# Patient Record
Sex: Female | Born: 1935 | Race: White | Hispanic: No | Smoking: Never smoker
Health system: Southern US, Community
[De-identification: ages and names within clinical notes are randomized; demographics above are authoritative.]

## PROBLEM LIST (undated history)

## (undated) DIAGNOSIS — F32A Depression, unspecified: Secondary | ICD-10-CM

## (undated) DIAGNOSIS — G629 Polyneuropathy, unspecified: Secondary | ICD-10-CM

## (undated) DIAGNOSIS — K219 Gastro-esophageal reflux disease without esophagitis: Secondary | ICD-10-CM

## (undated) DIAGNOSIS — F329 Major depressive disorder, single episode, unspecified: Secondary | ICD-10-CM

## (undated) DIAGNOSIS — I10 Essential (primary) hypertension: Secondary | ICD-10-CM

## (undated) DIAGNOSIS — E039 Hypothyroidism, unspecified: Secondary | ICD-10-CM

## (undated) DIAGNOSIS — M1990 Unspecified osteoarthritis, unspecified site: Secondary | ICD-10-CM

## (undated) HISTORY — PX: COLONOSCOPY: SHX174

## (undated) HISTORY — PX: ABDOMINAL HYSTERECTOMY: SHX81

## (undated) HISTORY — PX: DILATION AND CURETTAGE OF UTERUS: SHX78

## (undated) HISTORY — PX: BACK SURGERY: SHX140

## (undated) HISTORY — PX: APPENDECTOMY: SHX54

## (undated) HISTORY — PX: THYROIDECTOMY: SHX17

---

## 2011-12-09 ENCOUNTER — Encounter (INDEPENDENT_AMBULATORY_CARE_PROVIDER_SITE_OTHER): Payer: Self-pay | Admitting: *Deleted

## 2011-12-17 ENCOUNTER — Encounter (INDEPENDENT_AMBULATORY_CARE_PROVIDER_SITE_OTHER): Payer: Self-pay | Admitting: *Deleted

## 2011-12-17 ENCOUNTER — Other Ambulatory Visit (INDEPENDENT_AMBULATORY_CARE_PROVIDER_SITE_OTHER): Payer: Self-pay | Admitting: *Deleted

## 2011-12-17 ENCOUNTER — Telehealth (INDEPENDENT_AMBULATORY_CARE_PROVIDER_SITE_OTHER): Payer: Self-pay | Admitting: *Deleted

## 2011-12-17 DIAGNOSIS — Z86010 Personal history of colonic polyps: Secondary | ICD-10-CM

## 2011-12-17 DIAGNOSIS — Z1211 Encounter for screening for malignant neoplasm of colon: Secondary | ICD-10-CM

## 2011-12-17 NOTE — Telephone Encounter (Signed)
Patient needs movi prep 

## 2011-12-18 MED ORDER — PEG-KCL-NACL-NASULF-NA ASC-C 100 G PO SOLR: 1.0000 | Freq: Once | ORAL | Status: DC

## 2012-01-12 ENCOUNTER — Telehealth (INDEPENDENT_AMBULATORY_CARE_PROVIDER_SITE_OTHER): Payer: Self-pay | Admitting: *Deleted

## 2012-01-12 DIAGNOSIS — Z1211 Encounter for screening for malignant neoplasm of colon: Secondary | ICD-10-CM

## 2012-01-12 NOTE — Telephone Encounter (Signed)
Patient need trilyte, movi prep too expensive

## 2012-01-14 MED ORDER — PEG 3350-KCL-NA BICARB-NACL 420 G PO SOLR: 4000.0000 mL | Freq: Once | ORAL | Status: AC

## 2012-01-21 ENCOUNTER — Telehealth (INDEPENDENT_AMBULATORY_CARE_PROVIDER_SITE_OTHER): Payer: Self-pay | Admitting: *Deleted

## 2012-01-21 NOTE — Telephone Encounter (Signed)
PCP/Requesting MD: vyas  Name & DOB: Nicole Simon [birth year]   Procedure: tcs  Reason/Indication:  Hx polyps  Has patient had this procedure before?  yes  If so, when, by whom and where?  4 yrs ago, [facility].  Is there a family history of colon cancer?  no  Who?  What age when diagnosed?    Is patient diabetic?   no      Does patient have prosthetic heart valve?  no  Do you have a pacemaker?  no  Has patient had joint replacement within last 12 months?  no  Is patient on Coumadin, Plavix and/or Aspirin? yes  Medications: asa 81 mg daily, escitalopram 20 mg daily, potassium 10 mg daily, simvastatin 20 mg daily, amlodipine 5 mg daily, levothyroxine 88 mg daily, losartan 100 mg daily, hctz 25 mg daily  Allergies: pcn  Medication Adjustment: asa 2 days  Procedure date & time: 02/17/12 at 1030

## 2012-01-21 NOTE — Telephone Encounter (Signed)
agree

## 2012-02-09 ENCOUNTER — Encounter (HOSPITAL_COMMUNITY): Payer: Self-pay | Admitting: Pharmacy Technician

## 2012-02-16 MED ORDER — SODIUM CHLORIDE 0.45 % IV SOLN
INTRAVENOUS | Status: DC
  Administered 2012-02-17: 10:00:00 via INTRAVENOUS

## 2012-02-17 ENCOUNTER — Encounter (HOSPITAL_COMMUNITY): Payer: Self-pay | Admitting: *Deleted

## 2012-02-17 ENCOUNTER — Ambulatory Visit (HOSPITAL_COMMUNITY)
Admission: RE | Admit: 2012-02-17 | Discharge: 2012-02-17 | Disposition: A | Discharge status: Home / Self Care | Payer: Medicare Other | Source: Ambulatory Visit | Attending: Internal Medicine | Admitting: Internal Medicine

## 2012-02-17 ENCOUNTER — Encounter (HOSPITAL_COMMUNITY)
Admission: RE | Disposition: A | Discharge status: Home / Self Care | Payer: Self-pay | Source: Ambulatory Visit | Attending: Internal Medicine

## 2012-02-17 DIAGNOSIS — Z860100 Personal history of colon polyps, unspecified: Secondary | ICD-10-CM | POA: Insufficient documentation

## 2012-02-17 DIAGNOSIS — D126 Benign neoplasm of colon, unspecified: Secondary | ICD-10-CM | POA: Insufficient documentation

## 2012-02-17 DIAGNOSIS — I10 Essential (primary) hypertension: Secondary | ICD-10-CM | POA: Insufficient documentation

## 2012-02-17 HISTORY — PX: COLONOSCOPY: SHX5424

## 2012-02-17 HISTORY — DX: Essential (primary) hypertension: I10

## 2012-02-17 HISTORY — DX: Unspecified osteoarthritis, unspecified site: M19.90

## 2012-02-17 HISTORY — DX: Hypothyroidism, unspecified: E03.9

## 2012-02-17 SURGERY — COLONOSCOPY
Anesthesia: Moderate Sedation

## 2012-02-17 MED ORDER — MIDAZOLAM HCL 5 MG/5ML IJ SOLN
INTRAMUSCULAR | Status: DC | PRN
  Administered 2012-02-17 (×2): 2 mg via INTRAVENOUS
  Administered 2012-02-17 (×2): 1 mg via INTRAVENOUS

## 2012-02-17 MED ORDER — MEPERIDINE HCL 50 MG/ML IJ SOLN
INTRAMUSCULAR | Status: AC
  Filled 2012-02-17: qty 1

## 2012-02-17 MED ORDER — STERILE WATER FOR IRRIGATION IR SOLN
Status: DC | PRN
  Administered 2012-02-17: 10:00:00

## 2012-02-17 MED ORDER — MIDAZOLAM HCL 5 MG/5ML IJ SOLN
INTRAMUSCULAR | Status: AC
  Filled 2012-02-17: qty 10

## 2012-02-17 MED ORDER — MEPERIDINE HCL 50 MG/ML IJ SOLN
INTRAMUSCULAR | Status: DC | PRN
  Administered 2012-02-17 (×2): 25 mg via INTRAVENOUS

## 2012-02-17 NOTE — Op Note (Signed)
COLONOSCOPY PROCEDURE REPORT  PATIENT:  Nicole Simon  MR#:  [id number] Birthdate:  Mar 07, 1934, 78 y.o., female Endoscopist:  Dr. Malissa Hippo, MD Referred By:  Dr. Ignatius Specking, MD Procedure Date: 02/17/2012  Procedure:   Colonoscopy  Indications:  Patient is 78 year old Caucasian female who has  history of colonic adenomas and here for surveillance colonoscopy.  Informed Consent:  The procedure and risks were reviewed with the patient and informed consent was obtained.  Medications:  Demerol 50 mg IV Versed 6 mg IV  Description of procedure:  After a digital rectal exam was performed, that colonoscope was advanced from the anus through the rectum and colon to the area of the cecum, ileocecal valve and appendiceal orifice. The cecum was deeply intubated. These structures were well-seen and photographed for the record. From the level of the cecum and ileocecal valve, the scope was slowly and cautiously withdrawn. The mucosal surfaces were carefully surveyed utilizing scope tip to flexion to facilitate fold flattening as needed. The scope was pulled down into the rectum where a thorough exam including retroflexion was performed.  Findings:   Prep satisfactory. Few scattered diverticula at sigmoid colon. Four small polyps ablated via cold biopsy from transverse colon and submitted together. Three small polyps ablated via cold biopsy from splenic flexure and submitted together. Normal rectal mucosa. Two small anal papillae.  Therapeutic/Diagnostic Maneuvers Performed:  See above  Complications:  None  Cecal Withdrawal Time:  25  minutes  Impression:  Examination performed to cecum. Seven small polyps ablated via cold biopsy; four from transverse colon and submitted in one container and 3 from splenic flexure in other. Two small anal papillae.  Recommendations:  Standard instructions given. I will contact patient with biopsy results and further recommendations.  REHMAN,NAJEEB  U  02/17/2012 11:23 AM  CC: Dr. Ignatius Specking., MD & Dr. Bonnetta Barry ref. provider found

## 2012-02-17 NOTE — H&P (Signed)
Nicole Simon is an 78 y.o. female.   Chief Complaint: Patient is here for colonoscopy. HPI: Patient is 78 year old Caucasian female with history of colonic adenomas. This is patient's fourth exam. Her last exam was about 4 years ago when she was living in [place]. She denies rectal bleeding. She has chronic constipation and is on MiraLax. She complains of intermittent seepage. She has good appetite and stable weight. Family history is negative for colorectal carcinoma.  Past Medical History  Diagnosis Date  . Hypertension   . Arthritis   . Hypothyroidism     Past Surgical History  Procedure Date  . Thyroidectomy   . Abdominal hysterectomy   . Appendectomy   . Dilation and curettage of uterus   . Back surgery   . Colonoscopy     History reviewed. No pertinent family history. Social History:  reports that she has never smoked. She does not have any smokeless tobacco history on file. She reports that she does not drink alcohol or use illicit drugs.  Allergies:  Allergies  Allergen Reactions  . Penicillins     hives    Medications Prior to Admission  Medication Sig Dispense Refill  . amLODipine (NORVASC) 5 MG tablet Take 5 mg by mouth daily.      Marland Kitchen aspirin 81 MG tablet Take 81 mg by mouth daily.      Marland Kitchen escitalopram (LEXAPRO) 20 MG tablet Take 20 mg by mouth at bedtime.      . hydrochlorothiazide (HYDRODIURIL) 25 MG tablet Take 25 mg by mouth daily.      Marland Kitchen levothyroxine (SYNTHROID, LEVOTHROID) 88 MCG tablet Take 88 mcg by mouth daily.      Marland Kitchen losartan (COZAAR) 100 MG tablet Take 100 mg by mouth daily.      . Multiple Minerals-Vitamins (CITRACAL PLUS PO) Take 1 tablet by mouth every 3 (three) days.      . peg 3350 powder (MOVIPREP) 100 G SOLR Take 1 kit (100 g total) by mouth once.  1 kit  0  . polyethylene glycol (MIRALAX / GLYCOLAX) packet Take 17 g by mouth daily.      . potassium chloride (MICRO-K) 10 MEQ CR capsule Take 10 mEq by mouth daily.      . simvastatin (ZOCOR) 20  MG tablet Take 20 mg by mouth at bedtime.        No results found for this or any previous visit (from the past 48 hour(s)). No results found.  ROS  Blood pressure 130/69, pulse 76, temperature 97.6 F (36.4 C), temperature source Oral, resp. rate 18, height 5' 2.5" (1.588 m), weight 157 lb (71.215 kg), SpO2 93.00%. Physical Exam  Constitutional: She appears well-developed and well-nourished.  HENT:  Mouth/Throat: Oropharynx is clear and moist.  Eyes: Conjunctivae normal are normal. No scleral icterus.  Neck: No thyromegaly present.  Cardiovascular: Normal rate, regular rhythm and normal heart sounds.   No murmur heard. Respiratory: Effort normal and breath sounds normal.  GI: Soft. She exhibits no distension and no mass. There is no tenderness.       Appendectomy and lower midline scar  Musculoskeletal: She exhibits no edema.  Lymphadenopathy:    She has no cervical adenopathy.  Neurological: She is alert.  Skin: Skin is warm and dry.     Assessment/Plan History of colonic adenomas. Surveillance colonoscopy.  Zylen Wenig U 02/17/2012, 10:25 AM

## 2012-02-23 ENCOUNTER — Encounter (HOSPITAL_COMMUNITY): Payer: Self-pay | Admitting: Internal Medicine

## 2012-02-23 ENCOUNTER — Encounter (INDEPENDENT_AMBULATORY_CARE_PROVIDER_SITE_OTHER): Payer: Self-pay | Admitting: *Deleted

## 2012-03-30 ENCOUNTER — Encounter (INDEPENDENT_AMBULATORY_CARE_PROVIDER_SITE_OTHER): Payer: Self-pay | Admitting: *Deleted

## 2012-04-11 ENCOUNTER — Encounter (INDEPENDENT_AMBULATORY_CARE_PROVIDER_SITE_OTHER): Payer: Self-pay | Admitting: Internal Medicine

## 2012-04-11 ENCOUNTER — Ambulatory Visit (INDEPENDENT_AMBULATORY_CARE_PROVIDER_SITE_OTHER): Payer: Medicare Other | Admitting: Internal Medicine

## 2012-04-11 VITALS — BP 138/56 | HR 64 | Temp 98.0°F | Ht 62.0 in | Wt 158.3 lb

## 2012-04-11 DIAGNOSIS — K602 Anal fissure, unspecified: Secondary | ICD-10-CM

## 2012-04-11 DIAGNOSIS — E039 Hypothyroidism, unspecified: Secondary | ICD-10-CM

## 2012-04-11 DIAGNOSIS — I10 Essential (primary) hypertension: Secondary | ICD-10-CM

## 2012-04-11 DIAGNOSIS — E7800 Pure hypercholesterolemia, unspecified: Secondary | ICD-10-CM | POA: Insufficient documentation

## 2012-04-11 MED ORDER — HYDROCORTISONE 2.5 % RE CREA: TOPICAL_CREAM | Freq: Two times a day (BID) | RECTAL | Status: DC

## 2012-04-11 NOTE — Patient Instructions (Addendum)
Nitroglycerin oint 0.2% called to pharmacy.  Anusol supp PR.  PR in 2 weeks

## 2012-04-11 NOTE — Progress Notes (Signed)
Subjective:     Patient ID: Nicole Simon, female   DOB: 08/09/1933, 78 y.o.   MRN: [id number]  HPI Presents today with c/o that it hurts to sit down x 3 weeks. She also has fecal seepage for a while when she takes the Miralax. She has a hx of constipation She tells me she had surgery for a rectal fissure years ago.  Appetite is good. No weight loss. Usually has a BM 2-3 times a day with Miralax.  If she doesn't take Miralax daily, she will be constipated.  No melena or bright red rectal bleeding. 02/17/2012 Colonoscopy:Few scattered diverticula at sigmoid colon.  Four small polyps ablated via cold biopsy from transverse colon and submitted together.  Three small polyps ablated via cold biopsy from splenic flexure and submitted together.  Normal rectal mucosa.  Two small anal papillae. Biopsy: all were adenomas. Next colonoscopy in 3 yrs.  Review of Systems see hpi    Current Outpatient Prescriptions  Medication Sig Dispense Refill  . amLODipine (NORVASC) 5 MG tablet Take 5 mg by mouth daily.      Marland Kitchen aspirin 81 MG tablet Take 81 mg by mouth daily.      Marland Kitchen escitalopram (LEXAPRO) 20 MG tablet Take 20 mg by mouth at bedtime.      . hydrochlorothiazide (HYDRODIURIL) 25 MG tablet Take 25 mg by mouth daily.      Marland Kitchen levothyroxine (SYNTHROID, LEVOTHROID) 88 MCG tablet Take 88 mcg by mouth daily.      Marland Kitchen losartan (COZAAR) 100 MG tablet Take 100 mg by mouth daily.      . polyethylene glycol (MIRALAX / GLYCOLAX) packet Take 17 g by mouth daily.      . potassium chloride (MICRO-K) 10 MEQ CR capsule Take 10 mEq by mouth daily.      . simvastatin (ZOCOR) 20 MG tablet Take 20 mg by mouth at bedtime.       Past Medical History  Diagnosis Date  . Hypertension   . Arthritis   . Hypothyroidism    Past Surgical History  Procedure Date  . Thyroidectomy   . Abdominal hysterectomy   . Appendectomy   . Dilation and curettage of uterus   . Back surgery   . Colonoscopy   . Colonoscopy 02/17/2012   Procedure: COLONOSCOPY;  Surgeon: Malissa Hippo, MD;  Location: [facility];  Service: Endoscopy;  Laterality: N/A;  1030       Objective:   Physical Exam Filed Vitals:   04/11/12 1422  BP: 138/56  Pulse: 64  Temp: 98 F (36.7 C)  Height: 5\' 2"  (1.575 m)  Weight: 158 lb 4.8 oz (71.804 kg)    Alert and oriented. Skin warm and dry. Oral mucosa is moist.   . Sclera anicteric, conjunctivae is pink. Thyroid not enlarged. No cervical lymphadenopathy. Lungs clear. Heart regular rate and rhythm.  Abdomen is soft. Bowel sounds are positive. No hepatomegaly. No abdominal masses felt. No tenderness.  No edema to lower extremities.  Redness to rectum at 7 o clock. Very tender to the touch Rectal muscle tone good.     Assessment:      Probable rectal fissure from exam and given her hx.    Plan:    Nitroglycerin oint.Anusol supp  eprescribed to [facility]. PR in 2 weeks.

## 2012-04-14 ENCOUNTER — Encounter (INDEPENDENT_AMBULATORY_CARE_PROVIDER_SITE_OTHER): Payer: Self-pay

## 2014-04-09 ENCOUNTER — Ambulatory Visit (INDEPENDENT_AMBULATORY_CARE_PROVIDER_SITE_OTHER): Payer: Medicare Other | Admitting: Internal Medicine

## 2014-04-09 ENCOUNTER — Encounter (INDEPENDENT_AMBULATORY_CARE_PROVIDER_SITE_OTHER): Payer: Self-pay | Admitting: Internal Medicine

## 2014-04-09 VITALS — BP 140/70 | HR 82 | Temp 97.8°F | Resp 18 | Ht 62.0 in | Wt 159.6 lb

## 2014-04-09 DIAGNOSIS — K5901 Slow transit constipation: Secondary | ICD-10-CM

## 2014-04-09 DIAGNOSIS — R1012 Left upper quadrant pain: Secondary | ICD-10-CM

## 2014-04-09 DIAGNOSIS — K219 Gastro-esophageal reflux disease without esophagitis: Secondary | ICD-10-CM

## 2014-04-09 MED ORDER — PSYLLIUM 28.3 % PO POWD: 4.0000 g | Freq: Every day | ORAL | Status: DC

## 2014-04-09 MED ORDER — PANTOPRAZOLE SODIUM 40 MG PO TBEC: 40.0000 mg | DELAYED_RELEASE_TABLET | Freq: Every day | ORAL | Status: DC

## 2014-04-09 NOTE — Progress Notes (Signed)
Presenting complaint;  Left upper quadrant abdominal pain constipation and daily heartburn.  Subjective:  Patient is 80 year old Caucasian female who presents for scheduled visit. She says she has had constipation all of her life. A few years ago she went 6 weeks without a bowel movement. She is on high fiber diet. She is also taking polyethylene glycol. If she misses one day she may without a bowel movement for a few days. She also complains of left upper quadrant pain which radiates posteriorly. She is not short of this pain is relieved with defecation. Most of her stools are mushy. She she has  good appetite and her weight has been stable over the last 2 years. She denies melena or rectal bleeding. She recalls she was given Linzess samples by her PCP but she developed diarrhea and stopped this medication. She does not remember if she took 290 or 145 mcg. She also complains of daily heartburn and regurgitation. She was on pantoprazole which helps great deal but she stopped the medication because co-pay was $100. She states she gets plenty of exercise helping her brother who is undergoing therapy for leukemia.  Last colonoscopy was in October 2013 with removal of 7 small polyps all of which were tubular adenomas.   Current Medications: Outpatient Encounter Prescriptions as of 04/09/2014  Medication Sig  . amLODipine (NORVASC) 5 MG tablet Take 5 mg by mouth daily.  Marland Kitchen aspirin 81 MG tablet Take 81 mg by mouth daily.  Marland Kitchen escitalopram (LEXAPRO) 20 MG tablet Take 20 mg by mouth at bedtime.  Marland Kitchen FLUVIRIN SUSP   . gabapentin (NEURONTIN) 100 MG capsule Take 100 mg by mouth at bedtime.   . hydrochlorothiazide (HYDRODIURIL) 25 MG tablet Take 25 mg by mouth daily.  Marland Kitchen levothyroxine (SYNTHROID, LEVOTHROID) 88 MCG tablet Take 88 mcg by mouth daily.  Marland Kitchen losartan (COZAAR) 100 MG tablet Take 100 mg by mouth daily.  . polyethylene glycol (MIRALAX / GLYCOLAX) packet Take 17 g by mouth daily.  . potassium chloride  (MICRO-K) 10 MEQ CR capsule Take 10 mEq by mouth daily.  Marland Kitchen PREVNAR 13 SUSP injection   . simvastatin (ZOCOR) 20 MG tablet Take 20 mg by mouth at bedtime.  . [DISCONTINUED] hydrocortisone (PROCTOZONE-HC) 2.5 % rectal cream Place rectally 2 (two) times daily. (Patient not taking: Reported on 04/09/2014)     Objective: Blood pressure 140/70, pulse 82, temperature 97.8 F (36.6 C), temperature source Oral, resp. rate 18, height 5\' 2"  (1.575 m), weight 159 lb 9.6 oz (72.394 kg). Patient is alert and in no acute distress. Conjunctiva is pink. Sclera is nonicteric Oropharyngeal mucosa is normal. No neck masses or thyromegaly noted. Cardiac exam with regular rhythm normal S1 and S2. Nmurmur or gallop noted. Lungs are clear to auscultation. Abdomen is full. Bowel sounds are normal. Abdomen is soft with mild tenderness below the left costal margin. No organomegaly or masses noted. Rectal examination reveals guaiac-negative stool.  No LE edema or clubbing noted.   Assessment:  #1. Left upper quadrant abdominal pain. Most of her pain would appear to be referred from her back. Some of her pain may be due to constipation says she gets better when her bowels. #2. GERD. She is having daily symptoms and needs to go back on PPI. #. History of colonic adenomas. Last colonoscopy was in October 2013.   Plan:  Metamucil 4 g by mouth daily at bedtime. Pantoprazole 40 mg by mouth every morning. If it is not cost effective she will start taking  Prilosec OTC 20 mg by mouth every morning. Stool diary until office visit in 8 weeks.

## 2014-04-09 NOTE — Patient Instructions (Addendum)
Keep stool diary as to frequency and consistency of stools until next office visit.  If Pantoprazole co-pay is too high can take Prilosec OTC 20 mg by mouth 30 minutes before breakfast daily.

## 2014-06-11 ENCOUNTER — Encounter (INDEPENDENT_AMBULATORY_CARE_PROVIDER_SITE_OTHER): Payer: Self-pay | Admitting: Internal Medicine

## 2014-06-11 ENCOUNTER — Ambulatory Visit (INDEPENDENT_AMBULATORY_CARE_PROVIDER_SITE_OTHER): Payer: Medicare Other | Admitting: Internal Medicine

## 2014-06-11 VITALS — BP 118/76 | HR 74 | Temp 98.0°F | Resp 18 | Ht 62.0 in | Wt 158.8 lb

## 2014-06-11 DIAGNOSIS — K219 Gastro-esophageal reflux disease without esophagitis: Secondary | ICD-10-CM | POA: Insufficient documentation

## 2014-06-11 DIAGNOSIS — K5900 Constipation, unspecified: Secondary | ICD-10-CM | POA: Insufficient documentation

## 2014-06-11 DIAGNOSIS — Z86010 Personal history of colonic polyps: Secondary | ICD-10-CM | POA: Insufficient documentation

## 2014-06-11 DIAGNOSIS — K2100 Gastro-esophageal reflux disease with esophagitis, without bleeding: Secondary | ICD-10-CM

## 2014-06-11 DIAGNOSIS — K5901 Slow transit constipation: Secondary | ICD-10-CM

## 2014-06-11 MED ORDER — PANTOPRAZOLE SODIUM 40 MG PO TBEC: 40.0000 mg | DELAYED_RELEASE_TABLET | Freq: Every day | ORAL | Status: DC

## 2014-06-11 NOTE — Progress Notes (Signed)
Presenting complaint;  Follow-up for constipation and GERD.  Subjective:  Patient is 81 year old Caucasian female who presents for scheduled visit. She was last seen 2 months ago. She's been having problems with constipation. She is advised to take Metamucil polyethylene glycol daily. She was asked to keep stool diary which she has brought for review. Review of diary reveals that she has anywhere from 0-3 stools per day. She has gone as many as 4 days without a bowel movement but she is having 1-2 days a week when she has what she describes to be normal or "good bowel movement". She usually less bloating and feels miserable if she goes 2 days without a bowel movement. He denies melena or rectal bleeding. Her appetite is fair and her weight has been stable. She states her GERD symptoms are not well controlled. She has been waking up in the middle of night with sense of choking and regurgitation. On a few occasions she has vomited sour liquid. She also has sporadic dysphagia to solids. She states she has had her esophagus stretched a few times. Last EGD was 5 years ago when she was living in [place] when she was found to have esophagitis.     Current Medications: Outpatient Encounter Prescriptions as of 06/11/2014  Medication Sig  . amLODipine (NORVASC) 5 MG tablet Take 5 mg by mouth daily.  Marland Kitchen aspirin 81 MG tablet Take 81 mg by mouth daily.  Marland Kitchen escitalopram (LEXAPRO) 20 MG tablet Take 20 mg by mouth at bedtime.  Marland Kitchen FLUVIRIN SUSP   . gabapentin (NEURONTIN) 100 MG capsule Take 100 mg by mouth at bedtime.   . hydrochlorothiazide (HYDRODIURIL) 25 MG tablet Take 25 mg by mouth daily.  Marland Kitchen levothyroxine (SYNTHROID, LEVOTHROID) 88 MCG tablet Take 88 mcg by mouth daily.  Marland Kitchen losartan (COZAAR) 100 MG tablet Take 100 mg by mouth daily.  . pantoprazole (PROTONIX) 40 MG tablet Take 1 tablet (40 mg total) by mouth daily before breakfast.  . polyethylene glycol (MIRALAX / GLYCOLAX) packet Take 17 g by mouth daily.   . potassium chloride (MICRO-K) 10 MEQ CR capsule Take 10 mEq by mouth daily.  Marland Kitchen PREVNAR 13 SUSP injection   . Psyllium (METAMUCIL SMOOTH TEXTURE) 28.3 % POWD Take 4 g by mouth at bedtime.  . simvastatin (ZOCOR) 20 MG tablet Take 20 mg by mouth at bedtime.     Objective: Blood pressure 118/76, pulse 74, temperature 98 F (36.7 C), temperature source Oral, resp. rate 18, height 5\' 2"  (1.575 m), weight 158 lb 12.8 oz (72.031 kg). Patient is alert and in no acute distress. Conjunctiva is pink. Sclera is nonicteric Oropharyngeal mucosa is normal. No neck masses or thyromegaly noted. Cardiac exam with regular rhythm normal S1 and S2. No murmur or gallop noted. Lungs are clear to auscultation. Abdomen is symmetrical. Bowel sounds are normal. On palpation abdomen is soft with mild tenderness in LLQ. No organomegaly or masses.  No LE edema or clubbing noted.    Assessment:  #1. Chronic constipation. Review of stool diary reveals that she is still having intermittent spells of constipation where she goes 3-4 days without bowel movement. She is not consuming fiber rich foods. #2. Gastroesophageal reflux disease. She is having breakthrough symptoms at night. She may benefit from taking PPI before evening meal. She is having sporadic dysphagia. She has had her esophagus dilated when she was living in [place]. Will monitor this symptom for now. #3. History of colonic adenomas. Last colonoscopy was in October 2013 with  removal of multiple polyps which were all tubular adenomas. Next colonoscopy due in October 2013.    Plan:  Patient advised to eat one apple daily. He should also try to increase fiber rich foods, 1-2 portions with every meal. Take pantoprazole 30 units before evening meal rather than before breakfast. Patient will keep stool summary for another 2 months. Patient advised to call office if dysphagia experience more often. Office visit in June 2016.

## 2014-06-11 NOTE — Patient Instructions (Signed)
Remember to eat 1 apple daily. He must eat fiber rich foods 1-2 portion with each meal daily. Stool diary for next 2 months and send [place] the summary. Notify if swallowing difficulty gets worse

## 2014-09-24 ENCOUNTER — Encounter (INDEPENDENT_AMBULATORY_CARE_PROVIDER_SITE_OTHER): Payer: Self-pay | Admitting: Internal Medicine

## 2014-09-24 ENCOUNTER — Other Ambulatory Visit (INDEPENDENT_AMBULATORY_CARE_PROVIDER_SITE_OTHER): Payer: Self-pay | Admitting: *Deleted

## 2014-09-24 ENCOUNTER — Telehealth (INDEPENDENT_AMBULATORY_CARE_PROVIDER_SITE_OTHER): Payer: Self-pay | Admitting: *Deleted

## 2014-09-24 ENCOUNTER — Ambulatory Visit (INDEPENDENT_AMBULATORY_CARE_PROVIDER_SITE_OTHER): Payer: Medicare Other | Admitting: Internal Medicine

## 2014-09-24 VITALS — BP 120/68 | HR 78 | Temp 98.5°F | Resp 18 | Ht 62.0 in | Wt 160.4 lb

## 2014-09-24 DIAGNOSIS — K219 Gastro-esophageal reflux disease without esophagitis: Secondary | ICD-10-CM

## 2014-09-24 DIAGNOSIS — R1314 Dysphagia, pharyngoesophageal phase: Secondary | ICD-10-CM

## 2014-09-24 DIAGNOSIS — Z860100 Personal history of colon polyps, unspecified: Secondary | ICD-10-CM

## 2014-09-24 DIAGNOSIS — R1319 Other dysphagia: Secondary | ICD-10-CM

## 2014-09-24 DIAGNOSIS — K5901 Slow transit constipation: Secondary | ICD-10-CM

## 2014-09-24 DIAGNOSIS — K5909 Other constipation: Secondary | ICD-10-CM

## 2014-09-24 DIAGNOSIS — Z1211 Encounter for screening for malignant neoplasm of colon: Secondary | ICD-10-CM

## 2014-09-24 DIAGNOSIS — R1310 Dysphagia, unspecified: Secondary | ICD-10-CM

## 2014-09-24 MED ORDER — PANTOPRAZOLE SODIUM 40 MG PO TBEC: 40.0000 mg | DELAYED_RELEASE_TABLET | Freq: Two times a day (BID) | ORAL | Status: DC

## 2014-09-24 NOTE — Progress Notes (Signed)
Presenting complaint;  Follow-up for constipation and rectal pain. Patient complains of dysphagia.  Subjective:  Patient is 81 year old Caucasian female who presents for scheduled visit. She was last seen on 06/11/2014. She says she's not feeling well. She has multiple complaints. She did keep stool diary from February 9 through 08/10/2014 and sent [place] the summary. Stool diary reviewed with patient. She had normal stool or "good bowel movement" on 17 out of 60 days. She had small bowel movement and sense of incomplete evacuation 125 out of 60 days. She had no bowel movement on 17 out of 60 days. She had leakage on 2 days. She also complains of pelvic and rectal pain and at times feels like stool would not come out. She also complains of intermittent heartburn nausea and vomiting is having difficulty swallowing solids. She points to suprasternal area site of bolus obstruction. For eventually passes down. She has not lost any weight since her last visit. She states she has not been eating fiber rich foods as recommended. She states she's been under a lot of stress and spending time with her brother every day with leukemia and presently nursing home. His grandsons wife has psychiatric illness and she is very worried about her and her family.   Current Medications: Outpatient Encounter Prescriptions as of 09/24/2014  Medication Sig  . amLODipine (NORVASC) 5 MG tablet Take 5 mg by mouth daily.  Marland Kitchen aspirin 81 MG tablet Take 81 mg by mouth daily.  Marland Kitchen escitalopram (LEXAPRO) 20 MG tablet Take 20 mg by mouth at bedtime.  Marland Kitchen FLUVIRIN SUSP   . hydrochlorothiazide (HYDRODIURIL) 25 MG tablet Take 25 mg by mouth daily.  Marland Kitchen levothyroxine (SYNTHROID, LEVOTHROID) 88 MCG tablet Take 88 mcg by mouth daily.  Marland Kitchen losartan (COZAAR) 100 MG tablet Take 100 mg by mouth daily.  . pantoprazole (PROTONIX) 40 MG tablet Take 1 tablet (40 mg total) by mouth daily before supper.  . polyethylene glycol (MIRALAX / GLYCOLAX) packet Take  17 g by mouth daily.  . potassium chloride (MICRO-K) 10 MEQ CR capsule Take 10 mEq by mouth daily.  Marland Kitchen PREVNAR 13 SUSP injection   . Psyllium (METAMUCIL SMOOTH TEXTURE) 28.3 % POWD Take 4 g by mouth at bedtime. (Patient taking differently: Take 4 g by mouth at bedtime. Patient states that she take1 Tablespoon daily.)  . simvastatin (ZOCOR) 20 MG tablet Take 20 mg by mouth at bedtime.  . [DISCONTINUED] gabapentin (NEURONTIN) 100 MG capsule Take 100 mg by mouth at bedtime.    No facility-administered encounter medications on file as of 09/24/2014.     Objective: Blood pressure 120/68, pulse 78, temperature 98.5 F (36.9 C), temperature source Oral, resp. rate 18, height 5\' 2"  (1.575 m), weight 160 lb 6.4 oz (72.757 kg). Ration is alert and in no acute distress. Conjunctiva is pink. Sclera is nonicteric Oropharyngeal mucosa is normal. No neck masses or thyromegaly noted. Cardiac exam with regular rhythm normal S1 and S2. No murmur or gallop noted. Lungs are clear to auscultation. Abdomen is full but soft and nontender without organomegaly or masses.  No LE edema or clubbing noted.    Assessment:  #1. Chronic constipation. Review of stool diary reveals that she is having some results but clearly not to her satisfaction. We will try her on Linzess at low dose when samples available. Previous therapy by Dr. Woody Seller caused diarrhea and she stopped the medication. #2. Dysphagia.she could have motility disorder ring or stricture. GERD symptoms are not well controlled. #3. GERD.  Heartburn is not well controlled with therapy #4. Rectal pain possibly due to spasm.. #5. History of multiple colonic adenomas. Last colonoscopy was in June 2016 and she is due for surveillance colonoscopy.  Plan:  Increase pantoprazole to 40 mg by mouth twice a day. IBgard 1 capsule by mouth 3 times a day. EGD with ED and colonoscopy to be scheduled in the future. Will consider treating with Linzess when samples  available. Patient advised to undergo pelvic exam by her PCP to make sure she does not have large rectocele which would account for some of her symptoms.

## 2014-09-24 NOTE — Telephone Encounter (Signed)
Patient needs trilyte 

## 2014-09-24 NOTE — Patient Instructions (Signed)
IBgard one capsule by mouth 3 times a day. Esophagogastroduodenoscopy, esophageal dilation and colonoscopy to be scheduled

## 2014-09-26 MED ORDER — PEG 3350-KCL-NA BICARB-NACL 420 G PO SOLR: 4000.0000 mL | Freq: Once | ORAL | Status: DC

## 2014-10-02 ENCOUNTER — Telehealth (INDEPENDENT_AMBULATORY_CARE_PROVIDER_SITE_OTHER): Payer: Self-pay | Admitting: *Deleted

## 2014-10-02 NOTE — Telephone Encounter (Signed)
Dr.Rehman was made aware. 

## 2014-10-02 NOTE — Telephone Encounter (Signed)
Cathi Roan, NP would like for Dr. Laural Golden to know, she did the pelvic exam and their was no Recto Seal. Everything was normal.

## 2014-10-05 ENCOUNTER — Ambulatory Visit (HOSPITAL_COMMUNITY)
Admission: RE | Admit: 2014-10-05 | Discharge: 2014-10-05 | Disposition: A | Discharge status: Home / Self Care | Payer: Medicare Other | Source: Ambulatory Visit | Attending: Internal Medicine | Admitting: Internal Medicine

## 2014-10-05 ENCOUNTER — Encounter (HOSPITAL_COMMUNITY): Payer: Self-pay | Admitting: *Deleted

## 2014-10-05 ENCOUNTER — Encounter (HOSPITAL_COMMUNITY)
Admission: RE | Disposition: A | Discharge status: Home / Self Care | Payer: Self-pay | Source: Ambulatory Visit | Attending: Internal Medicine

## 2014-10-05 DIAGNOSIS — K449 Diaphragmatic hernia without obstruction or gangrene: Secondary | ICD-10-CM | POA: Diagnosis not present

## 2014-10-05 DIAGNOSIS — K5909 Other constipation: Secondary | ICD-10-CM

## 2014-10-05 DIAGNOSIS — K2950 Unspecified chronic gastritis without bleeding: Secondary | ICD-10-CM | POA: Diagnosis not present

## 2014-10-05 DIAGNOSIS — D122 Benign neoplasm of ascending colon: Secondary | ICD-10-CM | POA: Diagnosis not present

## 2014-10-05 DIAGNOSIS — Z7982 Long term (current) use of aspirin: Secondary | ICD-10-CM | POA: Diagnosis not present

## 2014-10-05 DIAGNOSIS — R1310 Dysphagia, unspecified: Secondary | ICD-10-CM | POA: Diagnosis not present

## 2014-10-05 DIAGNOSIS — E890 Postprocedural hypothyroidism: Secondary | ICD-10-CM | POA: Insufficient documentation

## 2014-10-05 DIAGNOSIS — K5520 Angiodysplasia of colon without hemorrhage: Secondary | ICD-10-CM | POA: Diagnosis not present

## 2014-10-05 DIAGNOSIS — K219 Gastro-esophageal reflux disease without esophagitis: Secondary | ICD-10-CM | POA: Diagnosis not present

## 2014-10-05 DIAGNOSIS — I10 Essential (primary) hypertension: Secondary | ICD-10-CM | POA: Insufficient documentation

## 2014-10-05 DIAGNOSIS — K6289 Other specified diseases of anus and rectum: Secondary | ICD-10-CM | POA: Diagnosis not present

## 2014-10-05 DIAGNOSIS — M1990 Unspecified osteoarthritis, unspecified site: Secondary | ICD-10-CM | POA: Diagnosis not present

## 2014-10-05 DIAGNOSIS — K5730 Diverticulosis of large intestine without perforation or abscess without bleeding: Secondary | ICD-10-CM | POA: Insufficient documentation

## 2014-10-05 DIAGNOSIS — Z860100 Personal history of colon polyps, unspecified: Secondary | ICD-10-CM

## 2014-10-05 DIAGNOSIS — K644 Residual hemorrhoidal skin tags: Secondary | ICD-10-CM | POA: Insufficient documentation

## 2014-10-05 DIAGNOSIS — Z79899 Other long term (current) drug therapy: Secondary | ICD-10-CM | POA: Insufficient documentation

## 2014-10-05 DIAGNOSIS — K2900 Acute gastritis without bleeding: Secondary | ICD-10-CM | POA: Diagnosis not present

## 2014-10-05 DIAGNOSIS — Z09 Encounter for follow-up examination after completed treatment for conditions other than malignant neoplasm: Secondary | ICD-10-CM | POA: Diagnosis present

## 2014-10-05 DIAGNOSIS — D125 Benign neoplasm of sigmoid colon: Secondary | ICD-10-CM | POA: Diagnosis not present

## 2014-10-05 DIAGNOSIS — K6389 Other specified diseases of intestine: Secondary | ICD-10-CM | POA: Diagnosis not present

## 2014-10-05 HISTORY — PX: COLONOSCOPY: SHX5424

## 2014-10-05 HISTORY — PX: ESOPHAGEAL DILATION: SHX303

## 2014-10-05 HISTORY — PX: ESOPHAGOGASTRODUODENOSCOPY: SHX5428

## 2014-10-05 SURGERY — COLONOSCOPY
Anesthesia: Moderate Sedation

## 2014-10-05 MED ORDER — SODIUM CHLORIDE 0.9 % IV SOLN
INTRAVENOUS | Status: DC
  Administered 2014-10-05: 08:00:00 via INTRAVENOUS

## 2014-10-05 MED ORDER — MIDAZOLAM HCL 5 MG/5ML IJ SOLN
INTRAMUSCULAR | Status: DC | PRN
  Administered 2014-10-05: 1 mg via INTRAVENOUS
  Administered 2014-10-05: 2 mg via INTRAVENOUS
  Administered 2014-10-05 (×5): 1 mg via INTRAVENOUS

## 2014-10-05 MED ORDER — MIDAZOLAM HCL 5 MG/5ML IJ SOLN
INTRAMUSCULAR | Status: AC
  Filled 2014-10-05: qty 10

## 2014-10-05 MED ORDER — SIMETHICONE 40 MG/0.6ML PO SUSP
ORAL | Status: DC | PRN
  Administered 2014-10-05: 09:00:00

## 2014-10-05 MED ORDER — MEPERIDINE HCL 50 MG/ML IJ SOLN
INTRAMUSCULAR | Status: AC
  Filled 2014-10-05: qty 1

## 2014-10-05 MED ORDER — MEPERIDINE HCL 50 MG/ML IJ SOLN
INTRAMUSCULAR | Status: DC | PRN
  Administered 2014-10-05 (×2): 25 mg via INTRAVENOUS

## 2014-10-05 NOTE — Discharge Instructions (Signed)
Resume usual medications and high fiber diet. No driving for 24 hours. Physician will call with results of biopsy and blood test next week  Gastrointestinal Endoscopy, Care After Refer to this sheet in the next few weeks. These instructions provide you with information on caring for yourself after your procedure. Your caregiver may also give you more specific instructions. Your treatment has been planned according to current medical practices, but problems sometimes occur. Call your caregiver if you have any problems or questions after your procedure. HOME CARE INSTRUCTIONS  If you were given medicine to help you relax (sedative), do not drive, operate machinery, or sign important documents for 24 hours.  Avoid alcohol and hot or warm beverages for the first 24 hours after the procedure.  Only take over-the-counter or prescription medicines for pain, discomfort, or fever as directed by your caregiver. You may resume taking your normal medicines unless your caregiver tells you otherwise. Ask your caregiver when you may resume taking medicines that may cause bleeding, such as aspirin, clopidogrel, or warfarin.  You may return to your normal diet and activities on the day after your procedure, or as directed by your caregiver. Walking may help to reduce any bloated feeling in your abdomen.  Drink enough fluids to keep your urine clear or pale yellow.  You may gargle with salt water if you have a sore throat. SEEK IMMEDIATE MEDICAL CARE IF:  You have severe nausea or vomiting.  You have severe abdominal pain, abdominal cramps that last longer than 6 hours, or abdominal swelling (distention).  You have severe shoulder or back pain.  You have trouble swallowing.  You have shortness of breath, your breathing is shallow, or you are breathing faster than normal.  You have a fever or a rapid heartbeat.  You vomit blood or material that looks like coffee grounds.  You have bloody, black, or  tarry stools. MAKE SURE YOU:  Understand these instructions.  Will watch your condition.  Will get help right away if you are not doing well or get worse. Document Released: 12/03/2003 Document Revised: 09/04/2013 Document Reviewed: 07/21/2011 [facility] Patient Information 2015 [place], [place]. This information is not intended to replace advice given to you by your health care provider. Make sure you discuss any questions you have with your health care provider. .  High-Fiber Diet Fiber is found in fruits, vegetables, and grains. A high-fiber diet encourages the addition of more whole grains, legumes, fruits, and vegetables in your diet. The recommended amount of fiber for adult males is 38 g per day. For adult females, it is 25 g per day. Pregnant and lactating women should get 28 g of fiber per day. If you have a digestive or bowel problem, ask your caregiver for advice before adding high-fiber foods to your diet. Eat a variety of high-fiber foods instead of only a select few type of foods.  PURPOSE  To increase stool bulk.  To make bowel movements more regular to prevent constipation.  To lower cholesterol.  To prevent overeating. WHEN IS THIS DIET USED?  It may be used if you have constipation and hemorrhoids.  It may be used if you have uncomplicated diverticulosis (intestine condition) and irritable bowel syndrome.  It may be used if you need help with weight management.  It may be used if you want to add it to your diet as a protective measure against atherosclerosis, diabetes, and cancer. SOURCES OF FIBER  Whole-grain breads and cereals.  Fruits, such as apples, oranges,  bananas, berries, prunes, and pears.  Vegetables, such as green peas, carrots, sweet potatoes, beets, broccoli, cabbage, spinach, and artichokes.  Legumes, such split peas, soy, lentils.  Almonds. FIBER CONTENT IN FOODS Starches and Grains / Dietary Fiber (g)  Cheerios, 1 cup / 3 g  Corn Flakes  cereal, 1 cup / 0.7 g  Rice crispy treat cereal, 1 cup / 0.3 g  Instant oatmeal (cooked),  cup / 2 g  Frosted wheat cereal, 1 cup / 5.1 g  Brown, long-grain rice (cooked), 1 cup / 3.5 g  White, long-grain rice (cooked), 1 cup / 0.6 g  Enriched macaroni (cooked), 1 cup / 2.5 g Legumes / Dietary Fiber (g)  Baked beans (canned, plain, or vegetarian),  cup / 5.2 g  Kidney beans (canned),  cup / 6.8 g  Pinto beans (cooked),  cup / 5.5 g Breads and Crackers / Dietary Fiber (g)  Plain or honey graham crackers, 2 squares / 0.7 g  Saltine crackers, 3 squares / 0.3 g  Plain, salted pretzels, 10 pieces / 1.8 g  Whole-wheat bread, 1 slice / 1.9 g  White bread, 1 slice / 0.7 g  Raisin bread, 1 slice / 1.2 g  Plain bagel, 3 oz / 2 g  Flour tortilla, 1 oz / 0.9 g  Corn tortilla, 1 small / 1.5 g  Hamburger or hotdog bun, 1 small / 0.9 g Fruits / Dietary Fiber (g)  Apple with skin, 1 medium / 4.4 g  Sweetened applesauce,  cup / 1.5 g  Banana,  medium / 1.5 g  Grapes, 10 grapes / 0.4 g  Orange, 1 small / 2.3 g  Raisin, 1.5 oz / 1.6 g  Melon, 1 cup / 1.4 g Vegetables / Dietary Fiber (g)  Green beans (canned),  cup / 1.3 g  Carrots (cooked),  cup / 2.3 g  Broccoli (cooked),  cup / 2.8 g  Peas (cooked),  cup / 4.4 g  Mashed potatoes,  cup / 1.6 g  Lettuce, 1 cup / 0.5 g  Corn (canned),  cup / 1.6 g  Tomato,  cup / 1.1 g Document Released: 04/20/2005 Document Revised: 10/20/2011 Document Reviewed: 07/23/2011 [employer] Patient Information 2015 [place], [place]. This information is not intended to replace advice given to you by your health care provider. Make sure you discuss any questions you have with your health care provider.  Colon Polyps Polyps are lumps of extra tissue growing inside the body. Polyps can grow in the large intestine (colon). Most colon polyps are noncancerous (benign). However, some colon polyps can become cancerous over time.  Polyps that are larger than a pea may be harmful. To be safe, caregivers remove and test all polyps. CAUSES  Polyps form when mutations in the genes cause your cells to grow and divide even though no more tissue is needed. RISK FACTORS There are a number of risk factors that can increase your chances of getting colon polyps. They include:  Being older than 50 years.  Family history of colon polyps or colon cancer.  Long-term colon diseases, such as colitis or Crohn disease.  Being overweight.  Smoking.  Being inactive.  Drinking too much alcohol. SYMPTOMS  Most small polyps do not cause symptoms. If symptoms are present, they may include:  Blood in the stool. The stool may look dark red or black.  Constipation or diarrhea that lasts longer than 1 week. DIAGNOSIS People often do not know they have polyps until their caregiver finds  them during a regular checkup. Your caregiver can use 4 tests to check for polyps:  Digital rectal exam. The caregiver wears gloves and feels inside the rectum. This test would find polyps only in the rectum.  Barium enema. The caregiver puts a liquid called barium into your rectum before taking X-rays of your colon. Barium makes your colon look white. Polyps are dark, so they are easy to see in the X-ray pictures.  Sigmoidoscopy. A thin, flexible tube (sigmoidoscope) is placed into your rectum. The sigmoidoscope has a light and tiny camera in it. The caregiver uses the sigmoidoscope to look at the last third of your colon.  Colonoscopy. This test is like sigmoidoscopy, but the caregiver looks at the entire colon. This is the most common method for finding and removing polyps. TREATMENT  Any polyps will be removed during a sigmoidoscopy or colonoscopy. The polyps are then tested for cancer. PREVENTION  To help lower your risk of getting more colon polyps:  Eat plenty of fruits and vegetables. Avoid eating fatty foods.  Do not smoke.  Avoid  drinking alcohol.  Exercise every day.  Lose weight if recommended by your caregiver.  Eat plenty of calcium and folate. Foods that are rich in calcium include milk, cheese, and broccoli. Foods that are rich in folate include chickpeas, kidney beans, and spinach. HOME CARE INSTRUCTIONS Keep all follow-up appointments as directed by your caregiver. You may need periodic exams to check for polyps. SEEK MEDICAL CARE IF: You notice bleeding during a bowel movement. Document Released: 01/15/2004 Document Revised: 07/13/2011 Document Reviewed: 06/30/2011 [facility] Patient Information 2015 [place], [place]. This information is not intended to replace advice given to you by your health care provider. Make sure you discuss any questions you have with your health care provider.  Hemorrhoids Hemorrhoids are swollen veins around the rectum or anus. There are two types of hemorrhoids:   Internal hemorrhoids. These occur in the veins just inside the rectum. They may poke through to the outside and become irritated and painful.  External hemorrhoids. These occur in the veins outside the anus and can be felt as a painful swelling or hard lump near the anus. CAUSES  Pregnancy.   Obesity.   Constipation or diarrhea.   Straining to have a bowel movement.   Sitting for long periods on the toilet.  Heavy lifting or other activity that caused you to strain.  Anal intercourse. SYMPTOMS   Pain.   Anal itching or irritation.   Rectal bleeding.   Fecal leakage.   Anal swelling.   One or more lumps around the anus.  DIAGNOSIS  Your caregiver may be able to diagnose hemorrhoids by visual examination. Other examinations or tests that may be performed include:   Examination of the rectal area with a gloved hand (digital rectal exam).   Examination of anal canal using a small tube (scope).   A blood test if you have lost a significant amount of blood.  A test to look inside the  colon (sigmoidoscopy or colonoscopy). TREATMENT Most hemorrhoids can be treated at home. However, if symptoms do not seem to be getting better or if you have a lot of rectal bleeding, your caregiver may perform a procedure to help make the hemorrhoids get smaller or remove them completely. Possible treatments include:   Placing a rubber band at the base of the hemorrhoid to cut off the circulation (rubber band ligation).   Injecting a chemical to shrink the hemorrhoid (sclerotherapy).   Using  a tool to burn the hemorrhoid (infrared light therapy).   Surgically removing the hemorrhoid (hemorrhoidectomy).   Stapling the hemorrhoid to block blood flow to the tissue (hemorrhoid stapling).  HOME CARE INSTRUCTIONS   Eat foods with fiber, such as whole grains, beans, nuts, fruits, and vegetables. Ask your doctor about taking products with added fiber in them (fibersupplements).  Increase fluid intake. Drink enough water and fluids to keep your urine clear or pale yellow.   Exercise regularly.   Go to the bathroom when you have the urge to have a bowel movement. Do not wait.   Avoid straining to have bowel movements.   Keep the anal area dry and clean. Use wet toilet paper or moist towelettes after a bowel movement.   Medicated creams and suppositories may be used or applied as directed.   Only take over-the-counter or prescription medicines as directed by your caregiver.   Take warm sitz baths for 15-20 minutes, 3-4 times a day to ease pain and discomfort.   Place ice packs on the hemorrhoids if they are tender and swollen. Using ice packs between sitz baths may be helpful.   Put ice in a plastic bag.   Place a towel between your skin and the bag.   Leave the ice on for 15-20 minutes, 3-4 times a day.   Do not use a donut-shaped pillow or sit on the toilet for long periods. This increases blood pooling and pain.  SEEK MEDICAL CARE IF:  You have increasing pain  and swelling that is not controlled by treatment or medicine.  You have uncontrolled bleeding.  You have difficulty or you are unable to have a bowel movement.  You have pain or inflammation outside the area of the hemorrhoids. MAKE SURE YOU:  Understand these instructions.  Will watch your condition.  Will get help right away if you are not doing well or get worse. Document Released: 04/17/2000 Document Revised: 04/06/2012 Document Reviewed: 02/23/2012 [facility] Patient Information 2015 [place], [place]. This information is not intended to replace advice given to you by your health care provider. Make sure you discuss any questions you have with your health care provider.  Hiatal Hernia A hiatal hernia occurs when part of your stomach slides above the muscle that separates your abdomen from your chest (diaphragm). You can be born with a hiatal hernia (congenital), or it may develop over time. In almost all cases of hiatal hernia, only the top part of the stomach pushes through.  Many people have a hiatal hernia with no symptoms. The larger the hernia, the more likely that you will have symptoms. In some cases, a hiatal hernia allows stomach acid to flow back into the tube that carries food from your mouth to your stomach (esophagus). This may cause heartburn symptoms. Severe heartburn symptoms may mean you have developed a condition called gastroesophageal reflux disease (GERD).  CAUSES  Hiatal hernias are caused by a weakness in the opening (hiatus) where your esophagus passes through your diaphragm to attach to the upper part of your stomach. You may be born with a weakness in your hiatus, or a weakness can develop. RISK FACTORS Older age is a major risk factor for a hiatal hernia. Anything that increases pressure on your diaphragm can also increase your risk of a hiatal hernia. This includes:  Pregnancy.  Excess weight.  Frequent constipation. SIGNS AND SYMPTOMS  People with a hiatal  hernia often have no symptoms. If symptoms develop, they are almost always caused  by GERD. They may include:  Heartburn.  Belching.  Indigestion.  Trouble swallowing.  Coughing or wheezing.  Sore throat.  Hoarseness.  Chest pain. DIAGNOSIS  A hiatal hernia is sometimes found during an exam for another problem. Your health care provider may suspect a hiatal hernia if you have symptoms of GERD. Tests may be done to diagnose GERD. These may include:  X-rays of your stomach or chest.  An upper gastrointestinal (GI) series. This is an X-ray exam of your GI tract involving the use of a chalky liquid that you swallow. The liquid shows up clearly on the X-ray.  Endoscopy. This is a procedure to look into your stomach using a thin, flexible tube that has a tiny camera and light on the end of it. TREATMENT  If you have no symptoms, you may not need treatment. If you have symptoms, treatment may include:  Dietary and lifestyle changes to help reduce GERD symptoms.  Medicines. These may include:  Over-the-counter antacids.  Medicines that make your stomach empty more quickly.  Medicines that block the production of stomach acid (H2 blockers).  Stronger medicines to reduce stomach acid (proton pump inhibitors).  You may need surgery to repair the hernia if other treatments are not helping. HOME CARE INSTRUCTIONS   Take all medicines as directed by your health care provider.  Quit smoking, if you smoke.  Try to achieve and maintain a healthy body weight.  Eat frequent small meals instead of three large meals a day. This keeps your stomach from getting too full.  Eat slowly.  Do not lie down right after eating.  Do noteat 1-2 hours before bed.   Do not drink beverages with caffeine. These include cola, coffee, cocoa, and tea.  Do not drink alcohol.  Avoid foods that can make symptoms of GERD worse. These may include:  Fatty foods.  Citrus fruits.  Other foods  and drinks that contain acid.  Avoid putting pressure on your belly. Anything that puts pressure on your belly increases the amount of acid that may be pushed up into your esophagus.   Avoid bending over, especially after eating.  Raise the head of your bed by putting blocks under the legs. This keeps your head and esophagus higher than your stomach.  Do not wear tight clothing around your chest or stomach.  Try not to strain when having a bowel movement, when urinating, or when lifting heavy objects. SEEK MEDICAL CARE IF:  Your symptoms are not controlled with medicines or lifestyle changes.  You are having trouble swallowing.  You have coughing or wheezing that will not go away. SEEK IMMEDIATE MEDICAL CARE IF:  Your pain is getting worse.  Your pain spreads to your arms, neck, jaw, teeth, or back.  You have shortness of breath.  You sweat for no reason.  You feel sick to your stomach (nauseous) or vomit.  You vomit blood.  You have bright red blood in your stools.  You have black, tarry stools.  Document Released: 07/11/2003 Document Revised: 09/04/2013 Document Reviewed: 04/07/2013 [facility] Patient Information 2015 [place], [place]. This information is not intended to replace advice given to you by your health care provider. Make sure you discuss any questions you have with your health care provider.

## 2014-10-05 NOTE — H&P (Signed)
Nicole Simon is an 81 y.o. female.   Chief Complaint: Patient is here for EGD, ED and colonoscopy. HPI: Patient is 81 year old Caucasian female was chronic GERD and now presents with solid food dysphagia. She has history of colonic polyps. She had 7 adenomas removed in October 2013. She has had adenomas removed on prior exams as well. She has chronic constipation and intermittent rectal pain. She denies Molina or rectal bleeding. She had pelvic exam by her PCP and she does not have rectocele.  Past Medical History  Diagnosis Date  . Hypertension   . Arthritis   . Hypothyroidism        GERD  Past Surgical History  Procedure Laterality Date  . Thyroidectomy    . Abdominal hysterectomy    . Appendectomy    . Dilation and curettage of uterus    . Back surgery    . Colonoscopy    . Colonoscopy  02/17/2012    Procedure: COLONOSCOPY;  Surgeon: Nicole Houston, MD;  Location: [facility];  Service: Endoscopy;  Laterality: N/A;  1030    History reviewed. No pertinent family history. Social History:  reports that she has never smoked. She has never used smokeless tobacco. She reports that she does not drink alcohol or use illicit drugs.  Allergies:  Allergies  Allergen Reactions  . Penicillins     hives    Medications Prior to Admission  Medication Sig Dispense Refill  . amLODipine (NORVASC) 5 MG tablet Take 5 mg by mouth daily.    Marland Kitchen aspirin 81 MG tablet Take 81 mg by mouth daily.    Marland Kitchen escitalopram (LEXAPRO) 20 MG tablet Take 20 mg by mouth at bedtime.    . hydrochlorothiazide (HYDRODIURIL) 25 MG tablet Take 25 mg by mouth daily.    Marland Kitchen levothyroxine (SYNTHROID, LEVOTHROID) 88 MCG tablet Take 88 mcg by mouth daily.    Marland Kitchen losartan (COZAAR) 100 MG tablet Take 100 mg by mouth daily.    . pantoprazole (PROTONIX) 40 MG tablet Take 1 tablet (40 mg total) by mouth 2 (two) times daily before a meal. 60 tablet 3  . polyethylene glycol (MIRALAX / GLYCOLAX) packet Take 17 g by mouth daily.    .  polyethylene glycol-electrolytes (NULYTELY/GOLYTELY) 420 G solution Take 4,000 mLs by mouth once. 4000 mL 0  . potassium chloride (MICRO-K) 10 MEQ CR capsule Take 10 mEq by mouth daily.    . Psyllium (METAMUCIL SMOOTH TEXTURE) 28.3 % POWD Take 4 g by mouth at bedtime. (Patient taking differently: Take 4 g by mouth at bedtime. Patient states that she take1 Tablespoon daily.)    . simvastatin (ZOCOR) 20 MG tablet Take 20 mg by mouth at bedtime.    Marland Kitchen FLUVIRIN SUSP     . PREVNAR 13 SUSP injection       No results found for this or any previous visit (from the past 48 hour(s)). No results found.  ROS  Blood pressure 136/69, pulse 63, temperature 97.8 F (36.6 C), temperature source Oral, resp. rate 18, height 5\' 2"  (1.575 m), weight 160 lb (72.576 kg), SpO2 94 %. Physical Exam  Constitutional: She appears well-developed and well-nourished.  HENT:  Mouth/Throat: Oropharynx is clear and moist.  Eyes: Conjunctivae are normal. No scleral icterus.  Neck: No thyromegaly present.  Cardiovascular: Normal rate, regular rhythm and normal heart sounds.   No murmur heard. Respiratory: Effort normal and breath sounds normal.  GI:  Abdomen is symmetrical soft and nontender. Lower midline scar noted.  Musculoskeletal: She exhibits no edema.  Lymphadenopathy:    She has no cervical adenopathy.  Neurological: She is alert.  Skin: Skin is warm and dry.     Assessment/Plan Solid food dysphagia in patient with chronic GERD. History of colonic adenomas. EGD with ED and colonoscopy.  Nicole Simon U 10/05/2014, 8:48 AM

## 2014-10-05 NOTE — Op Note (Signed)
EGD PROCEDURE REPORT  PATIENT:  Nicole Simon  MR#:  [id number] Birthdate:  February 05, 1934, 81 y.o., female Endoscopist:  Dr. Rogene Houston, MD  Procedure Date: 10/05/2014  Procedure:   EGD with ED & Colonoscopy  Indications:  Patient is 81 year old Caucasian female was chronic GERD and now presents with solid food dysphagia. She is also undergoing colonoscopy for surveillance purposes. She's had multiple colonic adenomas removed on prior exams. Last exam was in October 2013 with removal of 7 tubular adenomas.            Informed Consent:  The risks, benefits, alternatives & imponderables which include, but are not limited to, bleeding, infection, perforation, drug reaction and potential missed lesion have been reviewed.  The potential for biopsy, lesion removal, esophageal dilation, etc. have also been discussed.  Questions have been answered.  All parties agreeable.  Please see history & physical in medical record for more information.  Medications:  Demerol 50 mg IV Versed 8 mg IV Cetacaine spray topically for oropharyngeal anesthesia  EGD  Description of procedure:  The endoscope was introduced through the mouth and advanced to the second portion of the duodenum without difficulty or limitations. The mucosal surfaces were surveyed very carefully during advancement of the scope and upon withdrawal.  Findings:  Esophagus:  Mucosa of the esophagus was normal. GE junction was unremarkable without ring or stricture formation. GEJ:  31 cm Hiatus:  36 cm Stomach:  Stomach was empty and distended very well with insufflation. Folds in the proximal stomach were normal. Examination of mucosa at gastric body was normal. Multiple antral erosions noted. Pyloric channel was patent. Angularis fundus and cardia were examined by retroflexion of scope and were normal. Duodenum:  Normal bulbar and post bulbar mucosa.  Therapeutic/Diagnostic Maneuvers Performed:   Esophagus dilated by passing 48 French  Maloney dilator to full insertion. Esophageal mucosa was reexamined post dilation and no mucosal disruption noted.  COLONOSCOPY Description of procedure:  After a digital rectal exam was performed, that colonoscope was advanced from the anus through the rectum and colon to the area of the cecum and ileocecal appendiceal orifice not seen because cecum was not deeply intubated. . From the level of the cecum and ileocecal valve, the scope was slowly and cautiously withdrawn. The mucosal surfaces were carefully surveyed utilizing scope tip to flexion to facilitate fold flattening as needed. The scope was pulled down into the rectum where a thorough exam including retroflexion was performed. Slim scope was also used for this procedure.  Findings; Prep excellent. Blunt end of cecum not seen. We'll AV malformation noted across ileocecal valve. Small polyp ablated via cold biopsy from ascending colon Redundant tortuous colon with scattered diverticula at sigmoid colon. Normal rectal mucosa. Hemorrhoids below the dentate line along with anal papillae.   Therapeutic/Diagnostic Maneuvers Performed:  See above   Complications:  None  Cecal Withdrawal Time:  NA  Impression:  EGD findings; Moderate-sized sliding hiatal hernia without ring or stricture formation. Erosive antral gastritis. Esophagus dilated by passing 56 French Maloney dilator but no mucosal disruption noted.  Colonoscopy findings; Blunt end of cecum not seen. Single AV malformation across ileocecal valve. Small polyp ablated via cold biopsy from ascending colon. Tortuous and redundant colon. Mild sigmoid colon diverticulosis. External hemorrhoids and anal papillae.   Recommendations:  Standard instructions given. H. pylori serology. I will be contacting patient results of blood tests and biopsy.  Yazen Rosko U  10/05/2014 10:14 AM  CC: Dr. Glenda Chroman., MD & Dr.  No ref. provider found

## 2014-10-06 LAB — H. PYLORI ANTIBODY, IGG

## 2014-10-08 ENCOUNTER — Encounter (HOSPITAL_COMMUNITY): Payer: Self-pay | Admitting: Internal Medicine

## 2014-10-10 ENCOUNTER — Telehealth (INDEPENDENT_AMBULATORY_CARE_PROVIDER_SITE_OTHER): Payer: Self-pay | Admitting: *Deleted

## 2014-10-10 NOTE — Telephone Encounter (Signed)
Bland diet. Stool softener, Call with PR in the morning.

## 2014-10-10 NOTE — Telephone Encounter (Signed)
Nicole Simon said she had a procedure on 10/05/14 and she is having back pain, sick on stomach, stomach gripping (trouble going with the feeling she has to). Her return phone number is [phone].

## 2014-10-12 ENCOUNTER — Other Ambulatory Visit (INDEPENDENT_AMBULATORY_CARE_PROVIDER_SITE_OTHER): Payer: Self-pay | Admitting: Internal Medicine

## 2014-10-12 MED ORDER — ONDANSETRON HCL 4 MG PO TABS: 4.0000 mg | ORAL_TABLET | Freq: Three times a day (TID) | ORAL | Status: DC | PRN

## 2014-10-16 ENCOUNTER — Ambulatory Visit (INDEPENDENT_AMBULATORY_CARE_PROVIDER_SITE_OTHER): Payer: Medicare Other | Admitting: Internal Medicine

## 2014-10-18 ENCOUNTER — Telehealth (INDEPENDENT_AMBULATORY_CARE_PROVIDER_SITE_OTHER): Payer: Self-pay | Admitting: *Deleted

## 2014-10-18 NOTE — Telephone Encounter (Signed)
Patient called and advised not to take MiraLAX while she is on Linzess. She will take two thirds of the dose every morning. She knows how to figure out the dose. She will try this for few days or week and call us with progress report

## 2014-10-18 NOTE — Telephone Encounter (Signed)
Voice mail at 10:18 returned call at 10:28  She had some question about her Linzess.  She had a TCS last week and has not been taking Miralax with it but wasn't sure if she was or not.  I told probably not since they basically are doing the same thing.  She was little snappy telling me she knew what it did and was used for. She proceeded to tell me that she has been to restroom several times today and her stomach just started today has been little grippy.  She take it 1 time at night.  She had said she wanted to speak to our nurse and I explained Tammy was out today and we could ask Terri to help her.  She said NO I am only dealling with Dr. Laural Golden on this [place].  I explained that Karna Christmas was here to answer questions for the office when Dr. Laural Golden was at hospital or out.  She again snapped and said NO she would just wanted to see what Dr. Laural Golden had to say and didn't want to bother him.    I told her we would put message back and try to help her with this situation.

## 2014-10-23 ENCOUNTER — Encounter (INDEPENDENT_AMBULATORY_CARE_PROVIDER_SITE_OTHER): Payer: Self-pay | Admitting: *Deleted

## 2014-12-10 ENCOUNTER — Encounter (INDEPENDENT_AMBULATORY_CARE_PROVIDER_SITE_OTHER): Payer: Self-pay | Admitting: *Deleted

## 2014-12-25 ENCOUNTER — Ambulatory Visit (INDEPENDENT_AMBULATORY_CARE_PROVIDER_SITE_OTHER): Payer: Medicare Other | Admitting: Internal Medicine

## 2014-12-31 ENCOUNTER — Ambulatory Visit (INDEPENDENT_AMBULATORY_CARE_PROVIDER_SITE_OTHER): Payer: Medicare Other | Admitting: Internal Medicine

## 2015-02-06 ENCOUNTER — Other Ambulatory Visit (INDEPENDENT_AMBULATORY_CARE_PROVIDER_SITE_OTHER): Payer: Self-pay | Admitting: Internal Medicine

## 2015-08-01 ENCOUNTER — Other Ambulatory Visit (INDEPENDENT_AMBULATORY_CARE_PROVIDER_SITE_OTHER): Payer: Self-pay | Admitting: Internal Medicine

## 2015-08-01 NOTE — Telephone Encounter (Signed)
Patient will need an appointment prior to further refills or she may get from her PCP. 

## 2015-08-05 NOTE — Telephone Encounter (Signed)
We will contact the patient. Thank you, Nicole Simon. Miss you.

## 2015-08-05 NOTE — Telephone Encounter (Signed)
Tammy! Good Morning! I'm sending this back to you. I apologize. I don't think I can call the patient since I'm at another location today. Please let me know if there's anything else I need to do regarding her. Thank you!

## 2015-08-28 ENCOUNTER — Other Ambulatory Visit (INDEPENDENT_AMBULATORY_CARE_PROVIDER_SITE_OTHER): Payer: Self-pay | Admitting: Internal Medicine

## 2016-01-08 ENCOUNTER — Other Ambulatory Visit: Payer: Self-pay | Admitting: Orthopedic Surgery

## 2016-01-28 ENCOUNTER — Ambulatory Visit (INDEPENDENT_AMBULATORY_CARE_PROVIDER_SITE_OTHER): Payer: Medicare Other | Admitting: Internal Medicine

## 2016-01-28 ENCOUNTER — Encounter (INDEPENDENT_AMBULATORY_CARE_PROVIDER_SITE_OTHER): Payer: Self-pay | Admitting: Internal Medicine

## 2016-01-28 VITALS — BP 122/78 | HR 74 | Temp 98.0°F | Resp 18 | Ht 62.0 in | Wt 168.0 lb

## 2016-01-28 DIAGNOSIS — R1314 Dysphagia, pharyngoesophageal phase: Secondary | ICD-10-CM

## 2016-01-28 DIAGNOSIS — K219 Gastro-esophageal reflux disease without esophagitis: Secondary | ICD-10-CM

## 2016-01-28 DIAGNOSIS — K5900 Constipation, unspecified: Secondary | ICD-10-CM

## 2016-01-28 DIAGNOSIS — R1310 Dysphagia, unspecified: Secondary | ICD-10-CM

## 2016-01-28 DIAGNOSIS — R1319 Other dysphagia: Secondary | ICD-10-CM

## 2016-01-28 MED ORDER — DEXLANSOPRAZOLE 60 MG PO CPDR: 60.0000 mg | DELAYED_RELEASE_CAPSULE | Freq: Every day | ORAL | 5 refills | Status: DC

## 2016-01-28 NOTE — Progress Notes (Signed)
Presenting complaint;  Follow-up for GERD constipation and dysphagia.  Subjective:  Nicole Simon is 82 year old Caucasian female who is here for scheduled visit. She was last seen in May 2016. She was not able to come earlier because she had to help her daughter who had auto accident and needed help. She states heartburn is not well controlled. She has frequent postprandial regurgitation and daily heartburn. She has swallowing difficulty once or twice a week with fried foods which she stays away from. She has never had an episode of food impaction. She does not feel that swallowing difficulty has gotten worse since her last visit. She states her bowels move daily as long as she takes polythene glycol. If she misses one dose she will go 3 days without a bowel movement. She has good appetite. She has gained 8 pounds but she says most of the weight gain is due to knee brace. She had left knee arthroscopy about 3 weeks ago.  Current Medications: Outpatient Encounter Prescriptions as of 01/28/2016  Medication Sig  . amLODipine (NORVASC) 5 MG tablet Take 5 mg by mouth daily.  Marland Kitchen aspirin 81 MG tablet Take 81 mg by mouth daily.  Marland Kitchen escitalopram (LEXAPRO) 20 MG tablet Take 20 mg by mouth at bedtime.  . hydrochlorothiazide (HYDRODIURIL) 25 MG tablet Take 25 mg by mouth daily.  . hydroxychloroquine (PLAQUENIL) 200 MG tablet 200 mg 2 (two) times daily.  Marland Kitchen levothyroxine (SYNTHROID, LEVOTHROID) 88 MCG tablet Take 88 mcg by mouth daily.  Marland Kitchen losartan (COZAAR) 100 MG tablet Take 100 mg by mouth daily.  . methocarbamol (ROBAXIN) 500 MG tablet TAKE 1/2 TO 1 TABLET BY MOUTH EVERY 8 HOURS AS NEEDED FOR SPASM.  Marland Kitchen pantoprazole (PROTONIX) 40 MG tablet TAKE ONE TABLET BY MOUTH TWICE DAILY BEFORE MEALS  . polyethylene glycol (MIRALAX / GLYCOLAX) packet Take 17 g by mouth daily.  . potassium chloride (MICRO-K) 10 MEQ CR capsule Take 10 mEq by mouth daily.  . simvastatin (ZOCOR) 20 MG tablet Take 20 mg by mouth at bedtime.  .  [DISCONTINUED] FLUVIRIN SUSP   . [DISCONTINUED] ondansetron (ZOFRAN) 4 MG tablet Take 1 tablet (4 mg total) by mouth every 8 (eight) hours as needed for nausea or vomiting. (Patient not taking: Reported on 01/28/2016)  . [DISCONTINUED] PREVNAR 13 SUSP injection   . [DISCONTINUED] Psyllium (METAMUCIL SMOOTH TEXTURE) 28.3 % POWD Take 4 g by mouth at bedtime. (Patient not taking: Reported on 01/28/2016)   No facility-administered encounter medications on file as of 01/28/2016.      Objective: Blood pressure 122/78, pulse 74, temperature 98 F (36.7 C), temperature source Oral, resp. rate 18, height 5\' 2"  (1.575 m), weight 168 lb (76.2 kg). Patient is alert and in no acute distress. Conjunctiva is pink. Sclera is nonicteric Oropharyngeal mucosa is normal. No neck masses or thyromegaly noted. Cardiac exam with regular rhythm normal S1 and S2. No murmur or gallop noted. Lungs are clear to auscultation. Abdomen is symmetrical. She has mild tenderness at epigastrium and LLQ. No organomegaly or masses. No LE edema or clubbing noted. She has left knee brace.    Assessment:  #1. GERD. Symptoms are not well controlled with double dose PPI and diet treatment measures. EGD in June 2016 revealed moderate sized sliding hiatal hernia. #2. Esophageal dysphagia. Suspect she has esophageal motility disorder. Esophageal dilation in June last year provided no benefit. No further workup planned unless dysphagia worsens. #3. Chronic constipation. She is doing well with dietary measures and polyethylene glycol which she  should continue as long as it is working. Last colonoscopy was in June 2016 revealing tortuous colon.  Plan:  Discontinue pantoprazole. Dexilant 60 mg by mouth every morning. Patient will call if copay is too high. Patient will call if dysphagia worsens. Patient advised to call with progress report in 6-8 weeks. Office visit in one year.

## 2016-01-28 NOTE — Patient Instructions (Addendum)
Notify if Dexilant copay is too high or if it does not work. Call with progress report in 6-8 weeks regarding GERD.

## 2016-04-30 ENCOUNTER — Telehealth (INDEPENDENT_AMBULATORY_CARE_PROVIDER_SITE_OTHER): Payer: Self-pay | Admitting: Internal Medicine

## 2016-04-30 NOTE — Telephone Encounter (Signed)
Patient called, stated she is out of Nexium, she is not sure if this is the medication she needs or not.  She would like to know if she should have this refilled or go back to the Protonix?  [phone]

## 2016-05-01 NOTE — Telephone Encounter (Signed)
Patient was called back at 8:44 am . A message was left asking her if the Pantoprazole did work , if so , there are no refills. If this is the one that works to let us know so that we can get a refill on it. We do have samples of Dexilant.  Awaiting call back from patient.

## 2016-06-24 ENCOUNTER — Telehealth (INDEPENDENT_AMBULATORY_CARE_PROVIDER_SITE_OTHER): Payer: Self-pay | Admitting: Internal Medicine

## 2016-06-24 NOTE — Telephone Encounter (Signed)
Patient called for Nicole Simon, wants to know if you were able to talk to Dr. Laural Golden about her medication.  She's been out of medicine for the last week.  [phone]

## 2016-07-02 ENCOUNTER — Telehealth (INDEPENDENT_AMBULATORY_CARE_PROVIDER_SITE_OTHER): Payer: Self-pay | Admitting: *Deleted

## 2016-07-02 NOTE — Telephone Encounter (Signed)
Per Dr.Rehman may call in Nexium 40 mg -patient take 1 in the morning 30 minutes prior to breakfast, at night she may take over the counter Zantac 150 mg or Pepcid OTC. Rx was called to the patient's pharmacy and the patient was made aware.

## 2016-07-02 NOTE — Telephone Encounter (Signed)
When patient was called and told that we had called in the Nexium , she states that does not work. I ask her about the Pantoprazole , she says that taking that twice a day worked but that she has just anted to try something different. The Dexilant is to expensive.  Pantoprazole 40 mg  Take 1 po twice a day . #60 with 5 refills was called to [facility].

## 2016-07-02 NOTE — Telephone Encounter (Signed)
Will address with Dr.Rehman. 

## 2016-07-02 NOTE — Telephone Encounter (Signed)
Open in error

## 2016-11-30 ENCOUNTER — Encounter (INDEPENDENT_AMBULATORY_CARE_PROVIDER_SITE_OTHER): Payer: Self-pay | Admitting: *Deleted

## 2016-11-30 ENCOUNTER — Telehealth (INDEPENDENT_AMBULATORY_CARE_PROVIDER_SITE_OTHER): Payer: Self-pay | Admitting: *Deleted

## 2016-11-30 ENCOUNTER — Encounter (INDEPENDENT_AMBULATORY_CARE_PROVIDER_SITE_OTHER): Payer: Self-pay

## 2016-11-30 NOTE — Telephone Encounter (Signed)
Patient needs trilyte 

## 2016-11-30 NOTE — Telephone Encounter (Signed)
TRIAGE SHEET FOR PROCEDURE                                   ____PCP/Referring MD:  vyas   Procedure: tcs/egd  Reason/Indication:  iron def anemia  Has patient had this procedure before?  yes, 10/2014 both  If so, when, by whom and where?    Is there a family history of colon cancer?   no  Who?  What age when diagnosed?    Is patient diabetic?   no      Does patient have prosthetic heart valve or mechanical valve?  no  Do you have a pacemaker?  no  Has patient ever had endocarditis?    Has patient had joint replacement within last 12 months?  no  Does patient tend to be constipated or take laxatives? yes  Do you have a history of alcohol/drug use? no  Is patient on Coumadin, Plavix and/or Aspirin? yes    Medications: asa 81 mg daily, hctz 25 mg daily, losartan 100 mg daily, Lexapro 20 mg daily, simvastatin 20 mg daily, pantoprazole 40 mg bid, levothyroxine 75 mcg daily, potassium 10 mg daily, hydroxychloroquine 200 mg daily, amlodipine 5 mg daily, gabapentin 100 mg bid  Allergies: pcn  PHARMACY: [facility]  Medication adjustment: asa 2 days  PROCEDURE DATE & TIME: 12/11/16 at 155 (1250)

## 2016-12-01 MED ORDER — PEG 3350-KCL-NA BICARB-NACL 420 G PO SOLR: 4000.0000 mL | Freq: Once | ORAL | 0 refills | Status: AC

## 2016-12-01 NOTE — Telephone Encounter (Signed)
agree

## 2016-12-02 ENCOUNTER — Other Ambulatory Visit (INDEPENDENT_AMBULATORY_CARE_PROVIDER_SITE_OTHER): Payer: Self-pay | Admitting: *Deleted

## 2016-12-02 DIAGNOSIS — D509 Iron deficiency anemia, unspecified: Secondary | ICD-10-CM

## 2016-12-02 DIAGNOSIS — Z860100 Personal history of colon polyps, unspecified: Secondary | ICD-10-CM | POA: Insufficient documentation

## 2016-12-04 ENCOUNTER — Encounter (INDEPENDENT_AMBULATORY_CARE_PROVIDER_SITE_OTHER): Payer: Self-pay | Admitting: Internal Medicine

## 2016-12-11 ENCOUNTER — Encounter (HOSPITAL_COMMUNITY): Payer: Self-pay | Admitting: *Deleted

## 2016-12-11 ENCOUNTER — Encounter (HOSPITAL_COMMUNITY)
Admission: RE | Disposition: A | Discharge status: Home / Self Care | Payer: Self-pay | Source: Ambulatory Visit | Attending: Internal Medicine

## 2016-12-11 ENCOUNTER — Ambulatory Visit (HOSPITAL_COMMUNITY)
Admission: RE | Admit: 2016-12-11 | Discharge: 2016-12-11 | Disposition: A | Discharge status: Home / Self Care | Payer: Medicare Other | Source: Ambulatory Visit | Attending: Internal Medicine | Admitting: Internal Medicine

## 2016-12-11 DIAGNOSIS — F329 Major depressive disorder, single episode, unspecified: Secondary | ICD-10-CM | POA: Insufficient documentation

## 2016-12-11 DIAGNOSIS — I10 Essential (primary) hypertension: Secondary | ICD-10-CM | POA: Diagnosis not present

## 2016-12-11 DIAGNOSIS — G629 Polyneuropathy, unspecified: Secondary | ICD-10-CM | POA: Insufficient documentation

## 2016-12-11 DIAGNOSIS — K5730 Diverticulosis of large intestine without perforation or abscess without bleeding: Secondary | ICD-10-CM | POA: Diagnosis not present

## 2016-12-11 DIAGNOSIS — K6289 Other specified diseases of anus and rectum: Secondary | ICD-10-CM | POA: Diagnosis not present

## 2016-12-11 DIAGNOSIS — Z79899 Other long term (current) drug therapy: Secondary | ICD-10-CM | POA: Insufficient documentation

## 2016-12-11 DIAGNOSIS — K219 Gastro-esophageal reflux disease without esophagitis: Secondary | ICD-10-CM | POA: Diagnosis not present

## 2016-12-11 DIAGNOSIS — Z7982 Long term (current) use of aspirin: Secondary | ICD-10-CM | POA: Diagnosis not present

## 2016-12-11 DIAGNOSIS — Z880 Allergy status to penicillin: Secondary | ICD-10-CM | POA: Insufficient documentation

## 2016-12-11 DIAGNOSIS — K921 Melena: Secondary | ICD-10-CM | POA: Diagnosis not present

## 2016-12-11 DIAGNOSIS — E890 Postprocedural hypothyroidism: Secondary | ICD-10-CM | POA: Diagnosis not present

## 2016-12-11 DIAGNOSIS — D509 Iron deficiency anemia, unspecified: Secondary | ICD-10-CM | POA: Insufficient documentation

## 2016-12-11 DIAGNOSIS — Z86010 Personal history of colonic polyps: Secondary | ICD-10-CM | POA: Diagnosis not present

## 2016-12-11 DIAGNOSIS — K449 Diaphragmatic hernia without obstruction or gangrene: Secondary | ICD-10-CM | POA: Insufficient documentation

## 2016-12-11 DIAGNOSIS — M1990 Unspecified osteoarthritis, unspecified site: Secondary | ICD-10-CM | POA: Insufficient documentation

## 2016-12-11 DIAGNOSIS — K317 Polyp of stomach and duodenum: Secondary | ICD-10-CM | POA: Insufficient documentation

## 2016-12-11 DIAGNOSIS — K3189 Other diseases of stomach and duodenum: Secondary | ICD-10-CM | POA: Diagnosis not present

## 2016-12-11 HISTORY — DX: Gastro-esophageal reflux disease without esophagitis: K21.9

## 2016-12-11 HISTORY — PX: ESOPHAGOGASTRODUODENOSCOPY: SHX5428

## 2016-12-11 HISTORY — DX: Polyneuropathy, unspecified: G62.9

## 2016-12-11 HISTORY — DX: Major depressive disorder, single episode, unspecified: F32.9

## 2016-12-11 HISTORY — DX: Depression, unspecified: F32.A

## 2016-12-11 HISTORY — PX: COLONOSCOPY: SHX5424

## 2016-12-11 SURGERY — COLONOSCOPY
Anesthesia: Moderate Sedation

## 2016-12-11 MED ORDER — LIDOCAINE VISCOUS 2 % MT SOLN
OROMUCOSAL | Status: AC
  Filled 2016-12-11: qty 15

## 2016-12-11 MED ORDER — MIDAZOLAM HCL 5 MG/5ML IJ SOLN
INTRAMUSCULAR | Status: DC | PRN
  Administered 2016-12-11: 2 mg via INTRAVENOUS
  Administered 2016-12-11 (×2): 1 mg via INTRAVENOUS
  Administered 2016-12-11: 2 mg via INTRAVENOUS

## 2016-12-11 MED ORDER — LIDOCAINE VISCOUS 2 % MT SOLN
OROMUCOSAL | Status: DC | PRN
  Administered 2016-12-11: 4 mL via OROMUCOSAL

## 2016-12-11 MED ORDER — FLINTSTONES PLUS IRON PO CHEW: 1.0000 | CHEWABLE_TABLET | Freq: Two times a day (BID) | ORAL | Status: DC

## 2016-12-11 MED ORDER — MEPERIDINE HCL 50 MG/ML IJ SOLN
INTRAMUSCULAR | Status: DC
Start: 2016-12-11 — End: 2016-12-11
  Filled 2016-12-11: qty 1

## 2016-12-11 MED ORDER — MIDAZOLAM HCL 5 MG/5ML IJ SOLN
INTRAMUSCULAR | Status: AC
  Filled 2016-12-11: qty 10

## 2016-12-11 MED ORDER — SODIUM CHLORIDE 0.9 % IV SOLN
INTRAVENOUS | Status: DC
  Administered 2016-12-11: 13:00:00 via INTRAVENOUS

## 2016-12-11 MED ORDER — STERILE WATER FOR IRRIGATION IR SOLN
Status: DC | PRN
  Administered 2016-12-11: 200 mL

## 2016-12-11 MED ORDER — MEPERIDINE HCL 50 MG/ML IJ SOLN
INTRAMUSCULAR | Status: DC | PRN
  Administered 2016-12-11 (×2): 25 mg via INTRAVENOUS

## 2016-12-11 NOTE — Op Note (Signed)
[facility] Patient Name: Nicole Simon Procedure Date: 12/11/2016 1:58 PM MRN: [id number] Date of Birth: 1933-05-15 Attending MD: Hildred Laser , MD CSN: [id number] Age: 83 Admit Type: Outpatient Procedure:                Upper GI endoscopy Indications:              Melena Providers:                Hildred Laser, MD, Rosina Lowenstein, RN, Randa Spike, Technician Referring MD:             Glenda Chroman Medicines:                Lidocaine spray, Meperidine 50 mg IV, Midazolam 5                            mg IV Complications:            No immediate complications. Estimated Blood Loss:     Estimated blood loss: none. Procedure:                Pre-Anesthesia Assessment:                           - Prior to the procedure, a History and Physical                            was performed, and patient medications and                            allergies were reviewed. The patient's tolerance of                            previous anesthesia was also reviewed. The risks                            and benefits of the procedure and the sedation                            options and risks were discussed with the patient.                            All questions were answered, and informed consent                            was obtained. Prior Anticoagulants: The patient                            last took aspirin 2 days prior to the procedure.                            ASA Grade Assessment: II - A patient with mild  systemic disease. After reviewing the risks and                            benefits, the patient was deemed in satisfactory                            condition to undergo the procedure.                           After obtaining informed consent, the endoscope was                            passed under direct vision. Throughout the                            procedure, the patient's blood pressure, pulse, and               oxygen saturations were monitored continuously. The                            EG-[id number] ([id number]) scope was introduced through the                            mouth, and advanced to the second part of duodenum.                            The upper GI endoscopy was accomplished without                            difficulty. The patient tolerated the procedure                            well. Scope In: 2:25:20 PM Scope Out: 2:30:34 PM Total Procedure Duration: 0 hours 5 minutes 14 seconds  Findings:      The examined esophagus was normal.      The Z-line was regular and was found 30 cm from the incisors.      A 6 cm hiatal hernia was present.      A few small sessile polyps were found in the gastric fundus and in the       gastric body.      A few erosions were found in the prepyloric region of the stomach.      The exam of the stomach was otherwise normal. Impression:               - Normal esophagus.                           - Z-line regular, 30 cm from the incisors.                           - 6 cm hiatal hernia.                           - A few gastric polyps. These appear to be  hyperplastic polyps and were left alone.                           - Erosive gastropathy.                           - No specimens collected.                           comment: No lesion found to account for patient's                            melena.                           These nodesH. pylori serology was negative in June                            2016. Moderate Sedation:      Moderate (conscious) sedation was administered by the endoscopy nurse       and supervised by the endoscopist. The following parameters were       monitored: oxygen saturation, heart rate, blood pressure, CO2       capnography and response to care. Total physician intraservice time was       10 minutes. Recommendation:           - Patient has a contact number available for                             emergencies. The signs and symptoms of potential                            delayed complications were discussed with the                            patient. Return to normal activities tomorrow.                            Written discharge instructions were provided to the                            patient.                           - Resume previous diet today.                           - Continue present medications.                           - Resume aspirin at prior dose today.                           - See the other procedure note for documentation of                            additional recommendations. Procedure Code(s):        ---  Professional ---                           [phone], Esophagogastroduodenoscopy, flexible,                            transoral; diagnostic, including collection of                            specimen(s) by brushing or washing, when performed                            (separate procedure)                           99152, Moderate sedation services provided by the                            same physician or other qualified health care                            professional performing the diagnostic or                            therapeutic service that the sedation supports,                            requiring the presence of an independent trained                            observer to assist in the monitoring of the                            patient's level of consciousness and physiological                            status; initial 15 minutes of intraservice time,                            patient age 20 years or older Diagnosis Code(s):        --- Professional ---                           K44.9, Diaphragmatic hernia without obstruction or                            gangrene                           K31.7, Polyp of stomach and duodenum                           K31.89, Other diseases of stomach and duodenum                            K92.1, Melena (includes Hematochezia) CPT copyright 2016 American Medical  Association. All rights reserved. The codes documented in this report are preliminary and upon coder review may  be revised to meet current compliance requirements. Hildred Laser, MD Hildred Laser, MD 12/11/2016 3:15:37 PM This report has been signed electronically. Number of Addenda: 0

## 2016-12-11 NOTE — H&P (Signed)
Nicole Simon is an 83 y.o. female.   Chief Complaint: Patient is here for EGD and colonoscopy. HPI: Patient is 83 year old Caucasian female who was chronic GERD and known moderate size hiatal hernia as well as history of colonic polyps who presents with history of melena and drop in her H&H. She has developed iron deficiency anemia. She also complains of diarrhea every time she eats. She says she has lost 12 pounds since May 2018. She states she's been a lot of stress because of sickness and other family members. She to be in [place] tax of 6 weeks help her daughter. She is on low-dose aspirin but she does not take OTC NSAIDs. She feels heartburn is reasonably well controlled with PPI. She denies dysphagia. Her esophagus was dilated the time of last EGD in June 2016. At that time she also had colonoscopy removal of single small polyp. Cecum was not well seen. Family history is negative for CRC.  Past Medical History:  Diagnosis Date  . Arthritis   . Depression   . GERD (gastroesophageal reflux disease)   . Hypertension   . Hypothyroidism   . Peripheral neuropathy     Past Surgical History:  Procedure Laterality Date  . ABDOMINAL HYSTERECTOMY    . APPENDECTOMY    . BACK SURGERY    . COLONOSCOPY    . COLONOSCOPY  02/17/2012   Procedure: COLONOSCOPY;  Surgeon: Rogene Houston, MD;  [facility]: [facility];  Service: Endoscopy;  Laterality: N/A;  1030  . COLONOSCOPY N/A 10/05/2014   Procedure: COLONOSCOPY;  Surgeon: Rogene Houston, MD;  Location: [facility];  Service: Endoscopy;  Laterality: N/A;  855  . DILATION AND CURETTAGE OF UTERUS    . ESOPHAGEAL DILATION N/A 10/05/2014   Procedure: ESOPHAGEAL DILATION;  Surgeon: Rogene Houston, MD;  Location: [facility];  Service: Endoscopy;  Laterality: N/A;  . ESOPHAGOGASTRODUODENOSCOPY N/A 10/05/2014   Procedure: ESOPHAGOGASTRODUODENOSCOPY (EGD);  Surgeon: Rogene Houston, MD;  Location: [facility];  Service: Endoscopy;  Laterality: N/A;  .  THYROIDECTOMY      Family History  Problem Relation Age of Onset  . Colon cancer Neg Hx    Social History:  reports that she has never smoked. She has never used smokeless tobacco. She reports that she does not drink alcohol or use drugs.  Allergies:  Allergies  Allergen Reactions  . Penicillins Hives and Itching    Hives Has patient had a PCN reaction causing immediate rash, facial/tongue/throat swelling, SOB or lightheadedness with hypotension:Yes Has patient had a PCN reaction causing severe rash involving mucus membranes or skin necrosis:No Has patient had a PCN reaction that required hospitalization:No Has patient had a PCN reaction occurring within the last 10 years:No If all of the above answers are "NO", then may proceed with Cephalosporin use.     Medications Prior to Admission  Medication Sig Dispense Refill  . amLODipine (NORVASC) 5 MG tablet Take 5 mg by mouth daily.    Marland Kitchen aspirin EC 81 MG tablet Take 81 mg by mouth daily.    Marland Kitchen escitalopram (LEXAPRO) 20 MG tablet Take 20 mg by mouth at bedtime.    . gabapentin (NEURONTIN) 100 MG capsule Take 200 mg by mouth at bedtime.  12  . hydrochlorothiazide (HYDRODIURIL) 25 MG tablet Take 25 mg by mouth daily.    . hydroxychloroquine (PLAQUENIL) 200 MG tablet Take 200 mg by mouth daily.     Marland Kitchen levothyroxine (SYNTHROID, LEVOTHROID) 75 MCG tablet Take  75 mcg by mouth daily before breakfast.    . losartan (COZAAR) 100 MG tablet Take 100 mg by mouth daily.    . pantoprazole (PROTONIX) 40 MG tablet Take 40 mg by mouth 2 (two) times daily.  5  . polyethylene glycol (MIRALAX / GLYCOLAX) packet Take 17 g by mouth at bedtime.     . potassium chloride (K-DUR) 10 MEQ tablet Take 10 mEq by mouth daily.    . simvastatin (ZOCOR) 20 MG tablet Take 20 mg by mouth at bedtime.      No results found for this or any previous visit (from the past 48 hour(s)). No results found.  ROS  Blood pressure (!) 136/59, pulse 81, temperature 98.6 F (37  C), temperature source Oral, resp. rate 19, height 5\' 2"  (1.575 m), weight 156 lb (70.8 kg). Physical Exam  Constitutional: She appears well-developed and well-nourished.  HENT:  Mouth/Throat: Oropharynx is clear and moist.  Eyes: Conjunctivae are normal. No scleral icterus.  Neck: No thyromegaly present.  Cardiovascular: Normal rate, regular rhythm and normal heart sounds.   No murmur heard. Respiratory: Breath sounds normal.  GI: Soft. She exhibits no distension and no mass. There is no tenderness.  Musculoskeletal: She exhibits no edema.  Lymphadenopathy:    She has no cervical adenopathy.  Neurological: She is alert.  Skin: Skin is warm and dry.     Assessment/Plan History of melena, iron deficiency anemia. History of colonic polyps. Diagnostic EGD and colonoscopy.  Hildred Laser, MD 12/11/2016, 2:14 PM

## 2016-12-11 NOTE — Op Note (Signed)
[facility] Patient Name: Nicole Simon Procedure Date: 12/11/2016 2:32 PM MRN: [id number] Date of Birth: 09/16/1933 Attending MD: Hildred Laser , MD CSN: [id number] Age: 83 Admit Type: Outpatient Procedure:                Colonoscopy Indications:              Iron deficiency anemia Providers:                Hildred Laser, MD, Rosina Lowenstein, RN, Randa Spike, Technician Referring MD:             Glenda Chroman, MD Medicines:                Midazolam 1 mg IV Complications:            No immediate complications. Estimated Blood Loss:     Estimated blood loss: none. Procedure:                Pre-Anesthesia Assessment:                           - Prior to the procedure, a History and Physical                            was performed, and patient medications and                            allergies were reviewed. The patient's tolerance of                            previous anesthesia was also reviewed. The risks                            and benefits of the procedure and the sedation                            options and risks were discussed with the patient.                            All questions were answered, and informed consent                            was obtained. Prior Anticoagulants: The patient                            last took aspirin 2 days prior to the procedure.                            ASA Grade Assessment: II - A patient with mild                            systemic disease. After reviewing the risks and  benefits, the patient was deemed in satisfactory                            condition to undergo the procedure.                           After obtaining informed consent, the colonoscope                            was passed under direct vision. Throughout the                            procedure, the patient's blood pressure, pulse, and                            oxygen saturations were monitored  continuously. The                            EC-[id number] ([id number]) scope was introduced through                            the anus and advanced to the the cecum, identified                            by appendiceal orifice and ileocecal valve. The                            colonoscopy was technically difficult and complex                            due to significant looping and a tortuous colon.                            Successful completion of the procedure was aided by                            changing the patient's position, using manual                            pressure and withdrawing and reinserting the scope.                            colorrhaphy was also used The quality of the bowel                            preparation was good. The ileocecal valve,                            appendiceal orifice, and rectum were photographed. Scope In: 2:34:15 PM Scope Out: 3:03:04 PM Scope Withdrawal Time: 0 hours 7 minutes 44 seconds  Total Procedure Duration: 0 hours 28 minutes 49 seconds  Findings:      The perianal and digital rectal examinations were normal.      Multiple medium-mouthed diverticula were found  in the sigmoid colon.      Anal papilla(e) were hypertrophied. Impression:               - Diverticulosis in the sigmoid colon.                           - Anal papilla(e) were hypertrophied.                           - No specimens collected.                           Comment; Examination of cecum was complete unlike                            on last colonoscopy of June 2016. Moderate Sedation:      Moderate (conscious) sedation was administered by the endoscopy nurse       and supervised by the endoscopist. The following parameters were       monitored: oxygen saturation, heart rate, blood pressure, CO2       capnography and response to care. Total physician intraservice time was       32 minutes. Recommendation:           - Patient has a contact number available for                             emergencies. The signs and symptoms of potential                            delayed complications were discussed with the                            patient. Return to normal activities tomorrow.                            Written discharge instructions were provided to the                            patient.                           - Resume previous diet today.                           - Continue present medications.                           - No repeat colonoscopy due to age and the absence                            of advanced adenomas.                           - Flintstone chewable with iron 1 tablet twice  daily,                           - Will check CBC in 2 weeks.                           - If patient documented to have overt GI bleed will                            consider small bowel given capsule study. Procedure Code(s):        --- Professional ---                           [phone], Colonoscopy, flexible; diagnostic, including                            collection of specimen(s) by brushing or washing,                            when performed (separate procedure)                           99152, Moderate sedation services provided by the                            same physician or other qualified health care                            professional performing the diagnostic or                            therapeutic service that the sedation supports,                            requiring the presence of an independent trained                            observer to assist in the monitoring of the                            patient's level of consciousness and physiological                            status; initial 15 minutes of intraservice time,                            patient age 4 years or older                           [phone], Moderate sedation services; each additional                            15 minutes  intraservice time Diagnosis Code(s):        --- Professional ---  K62.89, Other specified diseases of anus and rectum                           D50.9, Iron deficiency anemia, unspecified                           K57.30, Diverticulosis of large intestine without                            perforation or abscess without bleeding CPT copyright 2016 American [employer]. All rights reserved. The codes documented in this report are preliminary and upon coder review may  be revised to meet current compliance requirements. Hildred Laser, MD Hildred Laser, MD 12/11/2016 3:21:20 PM This report has been signed electronically. Number of Addenda: 0

## 2016-12-11 NOTE — Discharge Instructions (Signed)
Resume usual medications and diet. Flintstones chewable tablet iron 1 tablet twice daily. No driving for 24 hours. Will check CBC in 2 weeks. Office will call.    Esophagogastroduodenoscopy, Care After Refer to this sheet in the next few weeks. These instructions provide you with information about caring for yourself after your procedure. Your health care provider may also give you more specific instructions. Your treatment has been planned according to current medical practices, but problems sometimes occur. Call your health care provider if you have any problems or questions after your procedure. What can I expect after the procedure? After the procedure, it is common to have:  A sore throat.  Nausea.  Bloating.  Dizziness.  Fatigue.  Follow these instructions at home:  Do not eat or drink anything until the numbing medicine (local anesthetic) has worn off and your gag reflex has returned. You will know that the local anesthetic has worn off when you can swallow comfortably.  Do not drive for 24 hours if you received a medicine to help you relax (sedative).  If your health care provider took a tissue sample for testing during the procedure, make sure to get your test results. This is your responsibility. Ask your health care provider or the department performing the test when your results will be ready.  Keep all follow-up visits as told by your health care provider. This is important. Contact a health care provider if:  You cannot stop coughing.  You are not urinating.  You are urinating less than usual. Get help right away if:  You have trouble swallowing.  You cannot eat or drink.  You have throat or chest pain that gets worse.  You are dizzy or light-headed.  You faint.  You have nausea or vomiting.  You have chills.  You have a fever.  You have severe abdominal pain.  You have black, tarry, or bloody stools. This information is not intended to replace  advice given to you by your health care provider. Make sure you discuss any questions you have with your health care provider. Document Released: 04/06/2012 Document Revised: 09/26/2015 Document Reviewed: 03/14/2015 Elsevier Interactive Patient Education  2018 [employer].    Colonoscopy, Adult, Care After This sheet gives you information about how to care for yourself after your procedure. Your doctor may also give you more specific instructions. If you have problems or questions, call your doctor. Follow these instructions at home: General instructions   For the first 24 hours after the procedure: ? Do not drive or use machinery. ? Do not sign important documents. ? Do not drink alcohol. ? Do your daily activities more slowly than normal. ? Eat foods that are soft and easy to digest. ? Rest often.  Take over-the-counter or prescription medicines only as told by your doctor.  It is up to you to get the results of your procedure. Ask your doctor, or the department performing the procedure, when your results will be ready. To help cramping and bloating:  Try walking around.  Put heat on your belly (abdomen) as told by your doctor. Use a heat source that your doctor recommends, such as a moist heat pack or a heating pad. ? Put a towel between your skin and the heat source. ? Leave the heat on for 20-30 minutes. ? Remove the heat if your skin turns bright red. This is especially important if you cannot feel pain, heat, or cold. You can get burned. Eating and drinking  Drink enough  fluid to keep your pee (urine) clear or pale yellow.  Return to your normal diet as told by your doctor. Avoid heavy or fried foods that are hard to digest.  Avoid drinking alcohol for as long as told by your doctor. Contact a doctor if:  You have blood in your poop (stool) 2-3 days after the procedure. Get help right away if:  You have more than a small amount of blood in your poop.  You see  large clumps of tissue (blood clots) in your poop.  Your belly is swollen.  You feel sick to your stomach (nauseous).  You throw up (vomit).  You have a fever.  You have belly pain that gets worse, and medicine does not help your pain. This information is not intended to replace advice given to you by your health care provider. Make sure you discuss any questions you have with your health care provider. Document Released: 05/23/2010 Document Revised: 01/13/2016 Document Reviewed: 01/13/2016 Elsevier Interactive Patient Education  2017 [employer]    Hiatal Hernia A hiatal hernia occurs when part of the stomach slides above the muscle that separates the abdomen from the chest (diaphragm). A person can be born with a hiatal hernia (congenital), or it may develop over time. In almost all cases of hiatal hernia, only the top part of the stomach pushes through the diaphragm. Many people have a hiatal hernia with no symptoms. The larger the hernia, the more likely it is that you will have symptoms. In some cases, a hiatal hernia allows stomach acid to flow back into the tube that carries food from your mouth to your stomach (esophagus). This may cause heartburn symptoms. Severe heartburn symptoms may mean that you have developed a condition called gastroesophageal reflux disease (GERD). What are the causes? This condition is caused by a weakness in the opening (hiatus) where the esophagus passes through the diaphragm to attach to the upper part of the stomach. A person may be born with a weakness in the hiatus, or a weakness can develop over time. What increases the risk? This condition is more likely to develop in:  Older people. Age is a major risk factor for a hiatal hernia, especially if you are over the age of 49.  Pregnant women.  People who are overweight.  People who have frequent constipation.  What are the signs or symptoms? Symptoms of this condition usually develop in the  form of GERD symptoms. Symptoms include:  Heartburn.  Belching.  Indigestion.  Trouble swallowing.  Coughing or wheezing.  Sore throat.  Hoarseness.  Chest pain.  Nausea and vomiting.  How is this diagnosed? This condition may be diagnosed during testing for GERD. Tests that may be done include:  X-rays of your stomach or chest.  An upper gastrointestinal (GI) series. This is an X-ray exam of your GI tract that is taken after you swallow a chalky liquid that shows up clearly on the X-ray.  Endoscopy. This is a procedure to look into your stomach using a thin, flexible tube that has a tiny camera and light on the end of it.  How is this treated? This condition may be treated by:  Dietary and lifestyle changes to help reduce GERD symptoms.  Medicines. These may include: ? Over-the-counter antacids. ? Medicines that make your stomach empty more quickly. ? Medicines that block the production of stomach acid (H2 blockers). ? Stronger medicines to reduce stomach acid (proton pump inhibitors).  Surgery to repair the hernia, if  other treatments are not helping.  If you have no symptoms, you may not need treatment. Follow these instructions at home: Lifestyle and activity  Do not use any products that contain nicotine or tobacco, such as cigarettes and e-cigarettes. If you need help quitting, ask your health care provider.  Try to achieve and maintain a healthy body weight.  Avoid putting pressure on your abdomen. Anything that puts pressure on your abdomen increases the amount of acid that may be pushed up into your esophagus. ? Avoid bending over, especially after eating. ? Raise the head of your bed by putting blocks under the legs. This keeps your head and esophagus higher than your stomach. ? Do not wear tight clothing around your chest or stomach. ? Try not to strain when having a bowel movement, when urinating, or when lifting heavy objects. Eating and  drinking  Avoid foods that can worsen GERD symptoms. These may include: ? Fatty foods, like fried foods. ? Citrus fruits, like oranges or lemon. ? Other foods and drinks that contain acid, like orange juice or tomatoes. ? Spicy food. ? Chocolate.  Eat frequent small meals instead of three large meals a day. This helps prevent your stomach from getting too full. ? Eat slowly. ? Do not lie down right after eating. ? Do not eat 1-2 hours before bed.  Do not drink beverages with caffeine. These include cola, coffee, cocoa, and tea.  Do not drink alcohol. General instructions  Take over-the-counter and prescription medicines only as told by your health care provider.  Keep all follow-up visits as told by your health care provider. This is important. Contact a health care provider if:  Your symptoms are not controlled with medicines or lifestyle changes.  You are having trouble swallowing.  You have coughing or wheezing that will not go away. Get help right away if:  Your pain is getting worse.  Your pain spreads to your arms, neck, jaw, teeth, or back.  You have shortness of breath.  You sweat for no reason.  You feel sick to your stomach (nauseous) or you vomit.  You vomit blood.  You have bright red blood in your stools.  You have black, tarry stools. This information is not intended to replace advice given to you by your health care provider. Make sure you discuss any questions you have with your health care provider. Document Released: 07/11/2003 Document Revised: 04/13/2016 Document Reviewed: 04/13/2016 Elsevier Interactive Patient Education  2018 [employer].    Diverticulosis Diverticulosis is a condition that develops when small pouches (diverticula) form in the wall of the large intestine (colon). The colon is where water is absorbed and stool is formed. The pouches form when the inside layer of the colon pushes through weak spots in the outer layers of the  colon. You may have a few pouches or many of them. What are the causes? The cause of this condition is not known. What increases the risk? The following factors may make you more likely to develop this condition:  Being older than age 21. Your risk for this condition increases with age. Diverticulosis is rare among people younger than age 76. By age 23, many people have it.  Eating a low-fiber diet.  Having frequent constipation.  Being overweight.  Not getting enough exercise.  Smoking.  Taking over-the-counter pain medicines, like aspirin and ibuprofen.  Having a family history of diverticulosis.  What are the signs or symptoms? In most people, there are no symptoms of  this condition. If you do have symptoms, they may include:  Bloating.  Cramps in the abdomen.  Constipation or diarrhea.  Pain in the lower left side of the abdomen.  How is this diagnosed? This condition is most often diagnosed during an exam for other colon problems. Because diverticulosis usually has no symptoms, it often cannot be diagnosed independently. This condition may be diagnosed by:  Using a flexible scope to examine the colon (colonoscopy).  Taking an X-ray of the colon after dye has been put into the colon (barium enema).  Doing a CT scan.  How is this treated? You may not need treatment for this condition if you have never developed an infection related to diverticulosis. If you have had an infection before, treatment may include:  Eating a high-fiber diet. This may include eating more fruits, vegetables, and grains.  Taking a fiber supplement.  Taking a live bacteria supplement (probiotic).  Taking medicine to relax your colon.  Taking antibiotic medicines.  Follow these instructions at home:  Drink 6-8 glasses of water or more each day to prevent constipation.  Try not to strain when you have a bowel movement.  If you have had an infection before: ? Eat more fiber as  directed by your health care provider or your diet and nutrition specialist (dietitian). ? Take a fiber supplement or probiotic, if your health care provider approves.  Take over-the-counter and prescription medicines only as told by your health care provider.  If you were prescribed an antibiotic, take it as told by your health care provider. Do not stop taking the antibiotic even if you start to feel better.  Keep all follow-up visits as told by your health care provider. This is important. Contact a health care provider if:  You have pain in your abdomen.  You have bloating.  You have cramps.  You have not had a bowel movement in 3 days. Get help right away if:  Your pain gets worse.  Your bloating becomes very bad.  You have a fever or chills, and your symptoms suddenly get worse.  You vomit.  You have bowel movements that are bloody or black.  You have bleeding from your rectum. Summary  Diverticulosis is a condition that develops when small pouches (diverticula) form in the wall of the large intestine (colon).  You may have a few pouches or many of them.  This condition is most often diagnosed during an exam for other colon problems.  If you have had an infection related to diverticulosis, treatment may include increasing the fiber in your diet, taking supplements, or taking medicines. This information is not intended to replace advice given to you by your health care provider. Make sure you discuss any questions you have with your health care provider. Document Released: 01/16/2004 Document Revised: 03/09/2016 Document Reviewed: 03/09/2016 Elsevier Interactive Patient Education  2017 [employer].

## 2016-12-15 ENCOUNTER — Telehealth (INDEPENDENT_AMBULATORY_CARE_PROVIDER_SITE_OTHER): Payer: Self-pay | Admitting: *Deleted

## 2016-12-15 NOTE — Telephone Encounter (Signed)
Per [facility] op note, patient needs CBC In 2 weeks

## 2016-12-16 ENCOUNTER — Encounter (HOSPITAL_COMMUNITY): Payer: Self-pay | Admitting: Internal Medicine

## 2016-12-16 ENCOUNTER — Encounter (INDEPENDENT_AMBULATORY_CARE_PROVIDER_SITE_OTHER): Payer: Self-pay | Admitting: *Deleted

## 2016-12-16 ENCOUNTER — Other Ambulatory Visit (INDEPENDENT_AMBULATORY_CARE_PROVIDER_SITE_OTHER): Payer: Self-pay | Admitting: *Deleted

## 2016-12-16 DIAGNOSIS — D508 Other iron deficiency anemias: Secondary | ICD-10-CM

## 2016-12-16 NOTE — Telephone Encounter (Signed)
Lab is noted for 12/30/2016. A letter has been sent to the patient.

## 2016-12-23 LAB — CBC
Hematocrit: 39 % (ref 34.0–46.6)
Hemoglobin: 11.8 g/dL (ref 11.1–15.9)
MCH: 23.3 pg — AB (ref 26.6–33.0)
MCHC: 30.3 g/dL — AB (ref 31.5–35.7)
MCV: 77 fL — ABNORMAL LOW (ref 79–97)
PLATELETS: 380 10*3/uL — AB (ref 150–379)
RBC: 5.06 x10E6/uL (ref 3.77–5.28)
RDW: 22.8 % — AB (ref 12.3–15.4)
WBC: 7.3 10*3/uL (ref 3.4–10.8)

## 2016-12-24 ENCOUNTER — Other Ambulatory Visit (INDEPENDENT_AMBULATORY_CARE_PROVIDER_SITE_OTHER): Payer: Self-pay | Admitting: *Deleted

## 2016-12-24 DIAGNOSIS — D508 Other iron deficiency anemias: Secondary | ICD-10-CM

## 2016-12-25 NOTE — Progress Notes (Signed)
Appointment was cancelled.

## 2016-12-29 ENCOUNTER — Ambulatory Visit (INDEPENDENT_AMBULATORY_CARE_PROVIDER_SITE_OTHER): Payer: Medicare Other | Admitting: Internal Medicine

## 2017-01-05 ENCOUNTER — Telehealth (INDEPENDENT_AMBULATORY_CARE_PROVIDER_SITE_OTHER): Payer: Self-pay | Admitting: Internal Medicine

## 2017-01-05 NOTE — Telephone Encounter (Signed)
She will bring a copy of her lab work by

## 2017-01-05 NOTE — Telephone Encounter (Signed)
Patient called, she has a copy of the labs that we sent her for.  She has some questions about them and did not want to wait until Tammy or Dr. Laural Golden was back.  She would like a call back.  [phone]

## 2017-01-08 ENCOUNTER — Other Ambulatory Visit (INDEPENDENT_AMBULATORY_CARE_PROVIDER_SITE_OTHER): Payer: Self-pay | Admitting: Internal Medicine

## 2017-02-25 ENCOUNTER — Other Ambulatory Visit (INDEPENDENT_AMBULATORY_CARE_PROVIDER_SITE_OTHER): Payer: Self-pay | Admitting: *Deleted

## 2017-02-25 ENCOUNTER — Encounter (INDEPENDENT_AMBULATORY_CARE_PROVIDER_SITE_OTHER): Payer: Self-pay | Admitting: *Deleted

## 2017-02-25 DIAGNOSIS — D508 Other iron deficiency anemias: Secondary | ICD-10-CM

## 2017-03-30 ENCOUNTER — Encounter (INDEPENDENT_AMBULATORY_CARE_PROVIDER_SITE_OTHER): Payer: Self-pay

## 2017-03-30 ENCOUNTER — Ambulatory Visit (INDEPENDENT_AMBULATORY_CARE_PROVIDER_SITE_OTHER): Payer: Medicare Other | Admitting: Internal Medicine

## 2017-03-30 ENCOUNTER — Encounter (INDEPENDENT_AMBULATORY_CARE_PROVIDER_SITE_OTHER): Payer: Self-pay | Admitting: Internal Medicine

## 2017-03-30 VITALS — BP 128/68 | HR 72 | Temp 97.8°F | Resp 18 | Ht 62.0 in | Wt 153.9 lb

## 2017-03-30 DIAGNOSIS — K5900 Constipation, unspecified: Secondary | ICD-10-CM | POA: Diagnosis not present

## 2017-03-30 DIAGNOSIS — K219 Gastro-esophageal reflux disease without esophagitis: Secondary | ICD-10-CM | POA: Diagnosis not present

## 2017-03-30 DIAGNOSIS — R1310 Dysphagia, unspecified: Secondary | ICD-10-CM

## 2017-03-30 DIAGNOSIS — R1319 Other dysphagia: Secondary | ICD-10-CM

## 2017-03-30 DIAGNOSIS — R634 Abnormal weight loss: Secondary | ICD-10-CM | POA: Diagnosis not present

## 2017-03-30 DIAGNOSIS — D509 Iron deficiency anemia, unspecified: Secondary | ICD-10-CM

## 2017-03-30 LAB — CBC
HEMOGLOBIN: 14 g/dL (ref 11.1–15.9)
Hematocrit: 41.4 % (ref 34.0–46.6)
MCH: 29 pg (ref 26.6–33.0)
MCHC: 33.8 g/dL (ref 31.5–35.7)
MCV: 86 fL (ref 79–97)
PLATELETS: 336 10*3/uL (ref 150–379)
RBC: 4.82 x10E6/uL (ref 3.77–5.28)
RDW: 14.6 % (ref 12.3–15.4)
WBC: 7.7 10*3/uL (ref 3.4–10.8)

## 2017-03-30 LAB — IRON AND TIBC
IRON SATURATION: 25 % (ref 15–55)
Iron: 87 ug/dL (ref 27–139)
TIBC: 346 ug/dL (ref 250–450)
UIBC: 259 ug/dL (ref 118–369)

## 2017-03-30 LAB — FERRITIN: Ferritin: 19 ng/mL (ref 15–150)

## 2017-03-30 MED ORDER — OMEPRAZOLE 20 MG PO CPDR: 20.0000 mg | DELAYED_RELEASE_CAPSULE | Freq: Two times a day (BID) | ORAL | 5 refills | Status: DC

## 2017-03-30 NOTE — Patient Instructions (Signed)
Weight check in 2 months. Notify if omeprazole does not work.

## 2017-03-30 NOTE — Progress Notes (Signed)
Presenting complaint;  Follow-up for GERD dysphagia constipation and iron deficiency anemia.  Database and subjective:  Patient is 83 year old Caucasian female who is here for scheduled visit.  She has multiple GI illnesses.  She has chronic GERD with moderate size sliding hiatal hernia as well as history of colonic adenomas and dysphagia felt to be due to esophageal motility disorder.  Back in July 2018 she was found to have iron deficiency anemia and reported history of melena.  She therefore underwent EGD and colonoscopy on 12/11/2016. EGD revealed moderate size sliding hiatal hernia without evidence of erosive esophagitis; hyperplastic appearing few gastric polyps erosive gastritis. H. pylori testing was not performed as H. pylori serology was negative in June 2016. Colonoscopy revealed sigmoid colon diverticulosis anal papillae with no other abnormalities were noted. It was felt the patient may have bled from her small intestine or she had IDA secondary to impaired iron absorption due to chronic PPI therapy.  Patient was begun on p.o. iron.  She had blood work yesterday as planned.   Patient states she does not feel well.  She has been under a lot of stress.  Since her last visit she has lost 15 pounds.  She states most of the weight loss occurred while she was visiting her daughter in [place].  She says her granddaughter who is 6 years old is not doing well.  She has bipolar disorder.  Patient states that she is going back to is her daughter for Christmas holidays.  She has daily heartburn.  She has sporadic nausea and vomiting.  She had an episode of vomiting yesterday eating potato salad and had similar episode 2 weeks ago.  She denies melena rectal bleeding or hematemesis.  She states her bowels move daily as long as she takes polyethylene glycol.  If she skips for a day she becomes irregular.  She states her dysphagia is about the same.  She has no difficulty with green beans cooked  broccoli fish and chicken.  She has tried Nexium in the past and it did not work.  She is not sure which other medications she has tried.  She does not take OTC NSAIDs other than low-dose aspirin.   Current Medications: Outpatient Encounter Medications as of 03/30/2017  Medication Sig  . amLODipine (NORVASC) 5 MG tablet Take 5 mg by mouth daily.  Marland Kitchen aspirin EC 81 MG tablet Take 81 mg by mouth daily.  Marland Kitchen gabapentin (NEURONTIN) 100 MG capsule Take 200 mg by mouth at bedtime.  . hydrochlorothiazide (HYDRODIURIL) 25 MG tablet Take 25 mg by mouth daily.  . hydroxychloroquine (PLAQUENIL) 200 MG tablet Take 200 mg by mouth daily.   . Levothyroxine Sodium 100 MCG CAPS Take 100 mcg by mouth daily before breakfast.   . losartan (COZAAR) 100 MG tablet Take 100 mg by mouth daily.  . pantoprazole (PROTONIX) 40 MG tablet TAKE ONE TABLET BY MOUTH TWICE DAILY.  Marland Kitchen Pediatric Multivitamins-Iron (FLINTSTONES PLUS IRON) chewable tablet Chew 1 tablet by mouth 2 (two) times daily.  . polyethylene glycol (MIRALAX / GLYCOLAX) packet Take 17 g by mouth at bedtime.   . potassium chloride (K-DUR) 10 MEQ tablet Take 10 mEq by mouth daily.  . simvastatin (ZOCOR) 20 MG tablet Take 20 mg by mouth at bedtime.  . [DISCONTINUED] escitalopram (LEXAPRO) 20 MG tablet Take 20 mg by mouth at bedtime.   No facility-administered encounter medications on file as of 03/30/2017.      Objective: Blood pressure 128/68, pulse 72, temperature  97.8 F (36.6 C), temperature source Oral, resp. rate 18, height 5\' 2"  (1.575 m), weight 153 lb 14.4 oz (69.8 kg). Patient is alert and in no acute distress. Conjunctiva is pink. Sclera is nonicteric Oropharyngeal mucosa is normal. No neck masses or thyromegaly noted. Cardiac exam with regular rhythm normal S1 and S2.  She has faint systolic murmur left lower sternal border. Lungs are clear to auscultation. Abdomen is symmetrical and soft with mild tenderness at LLQ.  No organomegaly or masses  noted. No LE edema or clubbing noted.  Labs/studies Results:  Recent Labs    Apr 24, 2017 1111  WBC 7.7  HGB 14.0  HCT 41.4  PLT 336     Assessment:  #1.  Iron deficiency anemia.  She had a EGD and colonoscopy in August 2018.  She had moderate size sliding hiatal hernia and erosive gastritis and sigmoid colon diverticulosis.  She has responded to p.o. iron.  Her H&H is now normal and iron stores are improving.  Unless she has another episode of overt GI bleed she will not need further evaluation with small bowel given capsule study.  #2.  Esophageal dysphagia.  She is suspected to have esophageal motility disorder.  She did not respond to esophageal dilation.  Will monitor her dysphagia for now.  If it gets worse we will consider barium pill esophagogram.  #3.  GERD.  She has moderate size sliding hiatal hernia.  She is not responding to double dose pantoprazole.  She did do well with dexlansoprazole but her co-pay was $200 a month.  Will try her or another PPI.  #4.  Constipation.  She is doing well with dietary measures and polyethylene glycol.  #5.  Weight loss.  Weight loss appears to be due to diminished intake while she was spending time with her daughter.  Patient is going back to [facility] again and she needs to make sure that she takes care of herself first before she can help others.   Plan:  Discontinue pantoprazole. Omeprazole 20 mg p.o. twice daily.  She will take it before breakfast and evening meal. Patient was given samples of Dexilant 30 mg capsules that she will take 1-2 a day if omeprazole does not work while she is in [place]. If omeprazole does not work will try her on lansoprazole. Weight check in 2 months. Office visit in 6 months.

## 2017-05-12 ENCOUNTER — Telehealth (INDEPENDENT_AMBULATORY_CARE_PROVIDER_SITE_OTHER): Payer: Self-pay | Admitting: Internal Medicine

## 2017-05-12 NOTE — Telephone Encounter (Signed)
Patient presented to the office for a weight check.  I weighed her.  Her weight was 150.7.

## 2017-05-14 NOTE — Telephone Encounter (Signed)
At the time of the patient's OV 03/30/17 , patient weighed 153 lbs 14.4 oz She weighed on 05/12/2017 at 150.7 lbs. Office visit is in May.

## 2017-05-17 NOTE — Telephone Encounter (Signed)
Weight  down by 3 pounds. We will recheck weight in 2 months since appointment is in 4 months for now.

## 2017-05-19 NOTE — Telephone Encounter (Signed)
She will come in the last of March or the first of April to weigh in. Patient already had an appointment in June, this should be okay for now.

## 2017-05-19 NOTE — Telephone Encounter (Signed)
Patient was called and made aware. She will come in the end of March or first part of April.

## 2017-07-09 ENCOUNTER — Other Ambulatory Visit (INDEPENDENT_AMBULATORY_CARE_PROVIDER_SITE_OTHER): Payer: Self-pay | Admitting: Internal Medicine

## 2017-10-12 ENCOUNTER — Ambulatory Visit (INDEPENDENT_AMBULATORY_CARE_PROVIDER_SITE_OTHER): Payer: Medicare Other | Admitting: Internal Medicine

## 2017-11-09 ENCOUNTER — Ambulatory Visit (INDEPENDENT_AMBULATORY_CARE_PROVIDER_SITE_OTHER): Payer: Medicare Other | Admitting: Internal Medicine

## 2017-11-09 ENCOUNTER — Encounter (INDEPENDENT_AMBULATORY_CARE_PROVIDER_SITE_OTHER): Payer: Self-pay | Admitting: Internal Medicine

## 2017-11-09 VITALS — BP 126/80 | Temp 98.3°F | Ht 61.0 in | Wt 148.9 lb

## 2017-11-09 DIAGNOSIS — K5900 Constipation, unspecified: Secondary | ICD-10-CM | POA: Diagnosis not present

## 2017-11-09 DIAGNOSIS — K219 Gastro-esophageal reflux disease without esophagitis: Secondary | ICD-10-CM

## 2017-11-09 DIAGNOSIS — R634 Abnormal weight loss: Secondary | ICD-10-CM

## 2017-11-09 MED ORDER — FLINTSTONES PLUS IRON PO CHEW: 1.0000 | CHEWABLE_TABLET | ORAL | Status: DC

## 2017-11-09 MED ORDER — BENEFIBER DRINK MIX PO PACK: 4.0000 g | PACK | Freq: Every day | ORAL | Status: DC

## 2017-11-09 MED ORDER — PANTOPRAZOLE SODIUM 40 MG PO TBEC: 40.0000 mg | DELAYED_RELEASE_TABLET | Freq: Two times a day (BID) | ORAL | 3 refills | Status: DC

## 2017-11-09 NOTE — Patient Instructions (Signed)
Increase physical activity as tolerated. Weight check in 3 months.

## 2017-11-09 NOTE — Progress Notes (Signed)
Presenting complaint;  Follow-up for constipation GERD and weight loss.  Subjective:  Patient has 84 year old Caucasian female who is here for scheduled visit.  She was last seen in November 2018. She does not feel well.  She has multiple complaints.  She has daily heartburn.  She denies dysphagia nausea or vomiting.  She feels omeprazole is not working anymore.  She does not have good appetite.  She generally eats 2 meals a day but no snacks.  She has lost another 5 pounds since her last visit.  She states she cries all the time and feels depressed.  She is being treated by Dr. Woody Seller.  She is not sure if medications helping.  She is worried about her daughter and granddaughter.  She lost sister-in-law in March this year her sister last month and a cousin 3 months ago.  She is active in her church and tries to read.  She says her bowels move daily.  At times her stool is loose and she may have a seepage.  She denies melena or rectal bleeding.  She also has difficulty walking because of knee arthritis.   Current Medications: Outpatient Encounter Medications as of 11/09/2017  Medication Sig  . amLODipine (NORVASC) 5 MG tablet Take 5 mg by mouth daily.  Marland Kitchen aspirin EC 81 MG tablet Take 81 mg by mouth daily.  . clonazePAM (KLONOPIN) 0.5 MG tablet Take 0.5 mg by mouth 2 (two) times daily as needed for anxiety.  . hydrochlorothiazide (HYDRODIURIL) 25 MG tablet Take 25 mg by mouth daily.  . hydroxychloroquine (PLAQUENIL) 200 MG tablet Take 200 mg by mouth daily.   . Levothyroxine Sodium 100 MCG CAPS Take 100 mcg by mouth daily before breakfast.   . losartan (COZAAR) 100 MG tablet Take 100 mg by mouth daily.  Marland Kitchen omeprazole (PRILOSEC) 20 MG capsule TAKE ONE CAPSULE BY MOUTH TWICE DAILY. TAKE BEFORE A MEAL.  Marland Kitchen Pediatric Multivitamins-Iron (FLINTSTONES PLUS IRON) chewable tablet Chew 1 tablet by mouth 2 (two) times daily.  . polyethylene glycol (MIRALAX / GLYCOLAX) packet Take 17 g by mouth at bedtime.   .  potassium chloride (K-DUR) 10 MEQ tablet Take 10 mEq by mouth daily.  . simvastatin (ZOCOR) 20 MG tablet Take 20 mg by mouth at bedtime.  . vortioxetine HBr (TRINTELLIX) 10 MG TABS tablet Take 10 mg by mouth daily.  Marland Kitchen gabapentin (NEURONTIN) 100 MG capsule Take 200 mg by mouth at bedtime.   No facility-administered encounter medications on file as of 11/09/2017.      Objective: Blood pressure 126/80, temperature 98.3 F (36.8 C), temperature source Oral, height 5\' 1"  (1.549 m), weight 148 lb 14.4 oz (67.5 kg). Patient is alert and in no acute distress. Conjunctiva is pink. Sclera is nonicteric Oropharyngeal mucosa is normal. No neck masses or thyromegaly noted. Cardiac exam with regular rhythm normal S1 and S2. No murmur or gallop noted. Lungs are clear to auscultation. Abdomen is symmetrical soft and nontender with organomegaly or masses. No LE edema or clubbing noted.  Assessment:  #1.  Chronic GERD.  Patient has known hiatal hernia but moderate in size.  Symptoms are not well controlled with current PPI.  She therefore will be switched to another agent.  She does not have dysphagia.  #2.  Chronic constipation.  She is having good results with polyethylene glycol.  Since she is having some liquid stools and incontinence she may be better off decreasing the dose and adding fiber supplement.  #3.  Gradual weight loss.  She has lost 5 pounds since her last visit 6 months ago.  Weight loss appears to be due to depression and poor intake.   Plan:  Discontinue omeprazole. Begin pantoprazole 40 mg p.o. twice daily. Benefiber 4 g p.o. Nightly. May decrease polyethylene glycol dose to three fourths of cup. Encourage patient to increase physical activity and social activity or interaction with family members and friends.   Weight check in 3 months.   Office visit in 6 months.  Will call Dr. Aletha Halim bring Dr. Woody Seller up-to-date about her depression and stress disorder.

## 2017-12-17 ENCOUNTER — Emergency Department (HOSPITAL_COMMUNITY)
Admission: EM | Admit: 2017-12-17 | Discharge: 2017-12-17 | Disposition: A | Discharge status: Home / Self Care | Payer: Medicare Other | Attending: Emergency Medicine | Admitting: Emergency Medicine

## 2017-12-17 ENCOUNTER — Encounter (HOSPITAL_COMMUNITY): Payer: Self-pay | Admitting: Emergency Medicine

## 2017-12-17 ENCOUNTER — Telehealth (INDEPENDENT_AMBULATORY_CARE_PROVIDER_SITE_OTHER): Payer: Self-pay | Admitting: *Deleted

## 2017-12-17 ENCOUNTER — Other Ambulatory Visit: Payer: Self-pay

## 2017-12-17 ENCOUNTER — Emergency Department (HOSPITAL_COMMUNITY): Payer: Medicare Other

## 2017-12-17 DIAGNOSIS — Z7982 Long term (current) use of aspirin: Secondary | ICD-10-CM | POA: Insufficient documentation

## 2017-12-17 DIAGNOSIS — R1032 Left lower quadrant pain: Secondary | ICD-10-CM | POA: Insufficient documentation

## 2017-12-17 DIAGNOSIS — R109 Unspecified abdominal pain: Secondary | ICD-10-CM

## 2017-12-17 DIAGNOSIS — R1012 Left upper quadrant pain: Secondary | ICD-10-CM | POA: Diagnosis not present

## 2017-12-17 DIAGNOSIS — E039 Hypothyroidism, unspecified: Secondary | ICD-10-CM | POA: Insufficient documentation

## 2017-12-17 DIAGNOSIS — Z79899 Other long term (current) drug therapy: Secondary | ICD-10-CM | POA: Insufficient documentation

## 2017-12-17 DIAGNOSIS — I10 Essential (primary) hypertension: Secondary | ICD-10-CM | POA: Insufficient documentation

## 2017-12-17 LAB — CBC WITH DIFFERENTIAL/PLATELET
Basophils Absolute: 0.1 10*3/uL (ref 0.0–0.1)
Basophils Relative: 1 %
Eosinophils Absolute: 0.1 10*3/uL (ref 0.0–0.7)
Eosinophils Relative: 1 %
HEMATOCRIT: 44.4 % (ref 36.0–46.0)
Hemoglobin: 14.5 g/dL (ref 12.0–15.0)
LYMPHS ABS: 2.9 10*3/uL (ref 0.7–4.0)
LYMPHS PCT: 32 %
MCH: 29.4 pg (ref 26.0–34.0)
MCHC: 32.7 g/dL (ref 30.0–36.0)
MCV: 90.1 fL (ref 78.0–100.0)
MONO ABS: 0.9 10*3/uL (ref 0.1–1.0)
Monocytes Relative: 10 %
NEUTROS ABS: 5.1 10*3/uL (ref 1.7–7.7)
Neutrophils Relative %: 56 %
Platelets: 308 10*3/uL (ref 150–400)
RBC: 4.93 MIL/uL (ref 3.87–5.11)
RDW: 14.2 % (ref 11.5–15.5)
WBC: 8.9 10*3/uL (ref 4.0–10.5)

## 2017-12-17 LAB — URINALYSIS, ROUTINE W REFLEX MICROSCOPIC
Bacteria, UA: NONE SEEN
Bilirubin Urine: NEGATIVE
Glucose, UA: NEGATIVE mg/dL
Ketones, ur: NEGATIVE mg/dL
Nitrite: NEGATIVE
Protein, ur: NEGATIVE mg/dL
Specific Gravity, Urine: 1.009 (ref 1.005–1.030)
pH: 7 (ref 5.0–8.0)

## 2017-12-17 LAB — COMPREHENSIVE METABOLIC PANEL
ALT: 13 U/L (ref 0–44)
AST: 17 U/L (ref 15–41)
Albumin: 3.9 g/dL (ref 3.5–5.0)
Alkaline Phosphatase: 58 U/L (ref 38–126)
Anion gap: 8 (ref 5–15)
BUN: 12 mg/dL (ref 8–23)
CO2: 31 mmol/L (ref 22–32)
Calcium: 8.1 mg/dL — ABNORMAL LOW (ref 8.9–10.3)
Chloride: 100 mmol/L (ref 98–111)
Creatinine, Ser: 0.64 mg/dL (ref 0.44–1.00)
GFR calc Af Amer: >60 mL/min (ref >60)
GFR calc non Af Amer: >60 mL/min (ref >60)
Glucose, Bld: 97 mg/dL (ref 70–99)
Potassium: 3.5 mmol/L (ref 3.5–5.1)
Sodium: 139 mmol/L (ref 135–145)
Total Bilirubin: 1 mg/dL (ref 0.3–1.2)
Total Protein: 7.1 g/dL (ref 6.5–8.1)

## 2017-12-17 LAB — LIPASE, BLOOD: Lipase: 28 U/L (ref 11–51)

## 2017-12-17 MED ORDER — ACETAMINOPHEN 325 MG PO TABS
650.0000 mg | ORAL_TABLET | Freq: Once | ORAL | Status: AC
  Administered 2017-12-17: 650 mg via ORAL
  Filled 2017-12-17: qty 2

## 2017-12-17 MED ORDER — IOHEXOL 300 MG/ML  SOLN
100.0000 mL | Freq: Once | INTRAMUSCULAR | Status: AC | PRN
  Administered 2017-12-17: 100 mL via INTRAVENOUS

## 2017-12-17 NOTE — Telephone Encounter (Signed)
Patient called the office and states that she is having pain in her abdomen  and her back. Its in her left side and after the pain there is soreness. She is taking the Miralax a capful every day and she is taking fiber as directed. Bowel Movements are watery with pieces of stool noted.  Discussed with Dr.Rehman and he ask that the patient take the fiber as directed and that she may use a Doculax Suppository every other day.  The patient has called back this morning and his recommendation was shared with her. She is in a lot of pain and tearful,agrees to try the suppository, she continues to pass water with pieces of stool. She states that she can't hardly stand this discomfort.  Dr.Rehman was paged.  Dr.Rehman ask that the patient go to the ED to insure that she is not impacted. Patient called and made aware. She plans to come to [facility].

## 2017-12-17 NOTE — Discharge Instructions (Addendum)
If your abdominal pain worsens or you develop vomiting, fever, back pain, chest pain, or any other new/concerning symptoms and return to the ER for evaluation.

## 2017-12-17 NOTE — ED Triage Notes (Signed)
Patient complaining of left flank pain x 1 week. Denies vomiting or diarrhea. Also she has problems with constipation and hasn't had a significant bowel movement in 2 weeks. Denies dysuria.

## 2017-12-17 NOTE — ED Provider Notes (Signed)
[facility] EMERGENCY DEPARTMENT Provider Note   CSN: [id number] Arrival date & time: 12/17/17  1139     History   Chief Complaint Chief Complaint  Patient presents with  . Flank Pain    HPI Nicole Simon is a 84 y.o. female.  HPI  84 year old female presents with left-sided abdominal and back pain.  Has been ongoing for about a month but more severe and progressively worsening over 1 week.  Her gastroenterologist recommended she come in today to be ruled out for fecal impaction.  She is been having long-standing history of constipation and when she wakes up she has some watery bowel movement and small bowel movements but no normal bowel movement in about 2 weeks or more.  No fevers, nausea, vomiting.  She has not tried anything for the pain.  No urinary symptoms.  Pain is rated as severe.  Past Medical History:  Diagnosis Date  . Arthritis   . Depression   . GERD (gastroesophageal reflux disease)   . Hypertension   . Hypothyroidism   . Peripheral neuropathy     Patient Active Problem List   Diagnosis Date Noted  . Hx of colonic polyps 12/02/2016  . Iron deficiency anemia 12/02/2016  . GERD (gastroesophageal reflux disease) 06/11/2014  . Constipation 06/11/2014  . History of colonic polyps 06/11/2014  . Hypertension 04/11/2012  . High cholesterol 04/11/2012  . Hypothyroid 04/11/2012    Past Surgical History:  Procedure Laterality Date  . ABDOMINAL HYSTERECTOMY    . APPENDECTOMY    . BACK SURGERY    . COLONOSCOPY    . COLONOSCOPY  02/17/2012   Procedure: COLONOSCOPY;  Surgeon: Rogene Houston, MD;  Location: [facility];  Service: Endoscopy;  Laterality: N/A;  1030  . COLONOSCOPY N/A 10/05/2014   Procedure: COLONOSCOPY;  Surgeon: Rogene Houston, MD;  Location: [facility];  Service: Endoscopy;  Laterality: N/A;  855  . COLONOSCOPY N/A 12/11/2016   Procedure: COLONOSCOPY;  Surgeon: Rogene Houston, MD;  Location: [facility];  Service: Endoscopy;  Laterality:  N/A;  155  . DILATION AND CURETTAGE OF UTERUS    . ESOPHAGEAL DILATION N/A 10/05/2014   Procedure: ESOPHAGEAL DILATION;  Surgeon: Rogene Houston, MD;  Location: [facility];  Service: Endoscopy;  Laterality: N/A;  . ESOPHAGOGASTRODUODENOSCOPY N/A 10/05/2014   Procedure: ESOPHAGOGASTRODUODENOSCOPY (EGD);  Surgeon: Rogene Houston, MD;  Location: [facility];  Service: Endoscopy;  Laterality: N/A;  . ESOPHAGOGASTRODUODENOSCOPY N/A 12/11/2016   Procedure: ESOPHAGOGASTRODUODENOSCOPY (EGD);  Surgeon: Rogene Houston, MD;  Location: [facility];  Service: Endoscopy;  Laterality: N/A;  . THYROIDECTOMY       OB History   None      Home Medications    Prior to Admission medications   Medication Sig Start Date End Date Taking? Authorizing Provider  amLODipine (NORVASC) 5 MG tablet Take 5 mg by mouth daily.   Yes [provider]  aspirin EC 81 MG tablet Take 81 mg by mouth daily.   Yes [provider]  clonazePAM (KLONOPIN) 0.5 MG tablet Take 0.5 mg by mouth 2 (two) times daily as needed for anxiety.   Yes [provider]  gabapentin (NEURONTIN) 100 MG capsule Take 200 mg by mouth at bedtime. 11/16/16  Yes [provider]  hydrochlorothiazide (HYDRODIURIL) 25 MG tablet Take 25 mg by mouth daily.   Yes [provider]  hydroxychloroquine (PLAQUENIL) 200 MG tablet Take 200 mg by mouth daily.  Yes [provider]  Levothyroxine Sodium 100 MCG CAPS Take 100 mcg by mouth daily before breakfast.  10/26/16  Yes [provider]  losartan (COZAAR) 100 MG tablet Take 100 mg by mouth daily.   Yes [provider]  pantoprazole (PROTONIX) 40 MG tablet Take 1 tablet (40 mg total) by mouth 2 (two) times daily before a meal. 11/09/17  Yes Rehman, Mechele Dawley, MD  polyethylene glycol (MIRALAX / GLYCOLAX) packet Take 17 g by mouth at bedtime.    Yes [provider]  potassium chloride (K-DUR) 10 MEQ tablet Take 10 mEq by mouth daily.  10/26/16  Yes [provider]  simvastatin (ZOCOR) 20 MG tablet Take 20 mg by mouth at bedtime.   Yes [provider]  vortioxetine HBr (TRINTELLIX) 10 MG TABS tablet Take 10 mg by mouth daily.   Yes [provider]  Wheat Dextrin (BENEFIBER DRINK MIX) PACK Take 4 g by mouth at bedtime. 11/09/17  Yes Rehman, Mechele Dawley, MD    Family History Family History  Problem Relation Age of Onset  . Colon cancer Neg Hx     Social History Social History   Tobacco Use  . Smoking status: Never Smoker  . Smokeless tobacco: Never Used  Substance Use Topics  . Alcohol use: No    Alcohol/week: 0.0 standard drinks  . Drug use: No     Allergies   Penicillins   Review of Systems Review of Systems  Constitutional: Negative for fever.  Gastrointestinal: Positive for abdominal pain and constipation. Negative for nausea and vomiting.  Genitourinary: Negative for dysuria and hematuria.  Musculoskeletal: Positive for back pain.  All other systems reviewed and are negative.    Physical Exam Updated Vital Signs BP (!) 142/57   Pulse 70   Temp 98.9 F (37.2 C) (Oral)   Resp 16   Ht 5\' 2"  (1.575 m)   Wt 67.1 kg   SpO2 94%   BMI 27.07 kg/m   Physical Exam  Constitutional: She is oriented to person, place, and time. She appears well-developed and well-nourished. No distress.  HENT:  Head: Normocephalic and atraumatic.  Right Ear: External ear normal.  Left Ear: External ear normal.  Nose: Nose normal.  Eyes: Right eye exhibits no discharge. Left eye exhibits no discharge.  Cardiovascular: Normal rate, regular rhythm and normal heart sounds.  Pulmonary/Chest: Effort normal and breath sounds normal.  Abdominal: Soft. There is tenderness in the left upper quadrant and left lower quadrant.  Genitourinary:  Genitourinary Comments: Essentially no stool on digital rectal exam.  Light brown with no impaction.  No gross blood.  Musculoskeletal:       Thoracic back: She  exhibits tenderness.       Back:  Neurological: She is alert and oriented to person, place, and time.  Skin: Skin is warm and dry. She is not diaphoretic.  Nursing note and vitals reviewed.    ED Treatments / Results  Labs (all labs ordered are listed, but only abnormal results are displayed) Labs Reviewed  URINALYSIS, ROUTINE W REFLEX MICROSCOPIC - Abnormal; Notable for the following components:      Result Value   Hgb urine dipstick SMALL (*)    Leukocytes, UA TRACE (*)    All other components within normal limits  COMPREHENSIVE METABOLIC PANEL - Abnormal; Notable for the following components:   Calcium 8.1 (*)    All other components within normal limits  LIPASE, BLOOD  CBC WITH DIFFERENTIAL/PLATELET  EKG None  Radiology Ct Abdomen Pelvis W Contrast  Result Date: 12/17/2017 CLINICAL DATA:  Left flank pain for 1 week. Constipation for 2 weeks. EXAM: CT ABDOMEN AND PELVIS WITH CONTRAST TECHNIQUE: Multidetector CT imaging of the abdomen and pelvis was performed using the standard protocol following bolus administration of intravenous contrast. CONTRAST:  100 mL OMNIPAQUE IOHEXOL 300 MG/ML  SOLN COMPARISON:  None. FINDINGS: Lower chest: Mild dependent atelectasis is seen. Heart size is upper normal. No pleural or pericardial effusion. Hepatobiliary: 1.6 cm simple cyst in the medial segment of the left hepatic lobe on image 21 is noted. The liver is otherwise normal in appearance. Gallbladder and biliary tree appear normal. Pancreas: Unremarkable. No pancreatic ductal dilatation or surrounding inflammatory changes. Spleen: Normal in size without focal abnormality. Adrenals/Urinary Tract: Two small simple right renal cysts are identified. The kidneys otherwise appear normal. Ureters and urinary bladder are unremarkable. The adrenal glands are normal in appearance. Stomach/Bowel: Moderate to moderately large hiatal hernia is seen. The stomach is otherwise normal in appearance. There is a  moderate volume of stool in the ascending and transverse colon. The colon is otherwise normal in appearance. The small bowel appears normal. The appendix has been removed. Vascular/Lymphatic: Aortic atherosclerosis. No enlarged abdominal or pelvic lymph nodes. Reproductive: Status post hysterectomy. No adnexal masses. Other: None. Musculoskeletal: No lytic or sclerotic lesion. Degenerative disease lumbar spine noted. IMPRESSION: No acute abnormality abdomen or pelvis. Moderate stool burden ascending and transverse colon. Moderate to moderately large hiatal hernia. Atherosclerosis. Electronically Signed   By: Inge Rise M.D.   On: 12/17/2017 14:58    Procedures Procedures (including critical care time)  Medications Ordered in ED Medications  iohexol (OMNIPAQUE) 300 MG/ML solution 100 mL (100 mLs Intravenous Contrast Given 12/17/17 1441)  acetaminophen (TYLENOL) tablet 650 mg (650 mg Oral Given 12/17/17 1528)     Initial Impression / Assessment and Plan / ED Course  I have reviewed the triage vital signs and the nursing notes.  Pertinent labs & imaging results that were available during my care of the patient were reviewed by me and considered in my medical decision making (see chart for details).     The patient is not impacted on exam.  The patient's lab work is reassuring and her urinalysis is not consistent with UTI.  CT scan obtained given age and left-sided tenderness.  There is no obvious diverticulitis.  No obvious acute abnormality to explain her symptoms.  Could be constipation related but at this point she appears stable for an outpatient work-up and treatment by her PCP.  I have advised her to use Tylenol.  No cardiac symptoms.  Return precautions.  Final Clinical Impressions(s) / ED Diagnoses   Final diagnoses:  Left sided abdominal pain    ED Discharge Orders    None       Sherwood Gambler, MD 12/17/17 1535

## 2018-05-17 ENCOUNTER — Encounter (INDEPENDENT_AMBULATORY_CARE_PROVIDER_SITE_OTHER): Payer: Self-pay | Admitting: Internal Medicine

## 2018-05-17 ENCOUNTER — Ambulatory Visit (INDEPENDENT_AMBULATORY_CARE_PROVIDER_SITE_OTHER): Payer: Medicare Other | Admitting: Internal Medicine

## 2018-05-17 VITALS — BP 133/71 | HR 83 | Temp 98.5°F | Resp 18 | Ht 61.0 in | Wt 150.1 lb

## 2018-05-17 DIAGNOSIS — K5900 Constipation, unspecified: Secondary | ICD-10-CM

## 2018-05-17 DIAGNOSIS — K219 Gastro-esophageal reflux disease without esophagitis: Secondary | ICD-10-CM | POA: Diagnosis not present

## 2018-05-17 DIAGNOSIS — R634 Abnormal weight loss: Secondary | ICD-10-CM

## 2018-05-17 DIAGNOSIS — R151 Fecal smearing: Secondary | ICD-10-CM

## 2018-05-17 MED ORDER — BISACODYL 10 MG RE SUPP: 10.0000 mg | RECTAL | 0 refills | Status: DC | PRN

## 2018-05-17 MED ORDER — POLYETHYLENE GLYCOL 3350 17 G PO PACK: 8.5000 g | PACK | Freq: Every day | ORAL | 0 refills | Status: DC

## 2018-05-17 MED ORDER — PSYLLIUM 58.6 % PO POWD: 1.0000 | Freq: Every day | ORAL | 12 refills | Status: DC

## 2018-05-17 NOTE — Progress Notes (Signed)
Presenting complaint;  Follow-up for GERD constipation and weight loss. New complaint is 1 of fecal seepage.  Subjective:  Patient is 85 year old Caucasian female who is here for scheduled visit.  She was last seen in July 2019 and she was felt to be depressed.  Dr. Woody Seller was informed of her condition.  She has seen Dr. Woody Seller since then and she feels her depression is better controlled.  She says her bowels move every other day but at times she can go as many as 5 days without a bowel movement.  Lately she has noted leakage.  She is wearing a pad.  She says leakage is been going on for 6 months but it has gotten worse lately.  She takes Benefiber every day.  She denies melena or rectal bleeding.  She also has bloating and pain in left lower quadrant which comes and goes.  She says her appetite is better.  She has daily heartburn but it is transient.  She denies nausea vomiting or dysphagia.  She is not able to do much walking because of left knee arthritis.  She feels her appetite has improved with decrease in her stress.   Current Medications: Outpatient Encounter Medications as of 05/17/2018  Medication Sig  . amLODipine (NORVASC) 5 MG tablet Take 5 mg by mouth daily.  Marland Kitchen aspirin EC 81 MG tablet Take 81 mg by mouth daily.  . clonazePAM (KLONOPIN) 0.5 MG tablet Take 0.5 mg by mouth 2 (two) times daily as needed for anxiety.  . gabapentin (NEURONTIN) 100 MG capsule Take 200 mg by mouth at bedtime.  . hydrochlorothiazide (HYDRODIURIL) 25 MG tablet Take 25 mg by mouth daily.  . hydroxychloroquine (PLAQUENIL) 200 MG tablet Take 200 mg by mouth daily.   . Levothyroxine Sodium 100 MCG CAPS Take 100 mcg by mouth daily before breakfast.   . losartan (COZAAR) 100 MG tablet Take 100 mg by mouth daily.  . pantoprazole (PROTONIX) 40 MG tablet Take 1 tablet (40 mg total) by mouth 2 (two) times daily before a meal.  . polyethylene glycol (MIRALAX / GLYCOLAX) packet Take 17 g by mouth at bedtime.   . potassium  chloride (K-DUR) 10 MEQ tablet Take 10 mEq by mouth daily.  . simvastatin (ZOCOR) 20 MG tablet Take 20 mg by mouth at bedtime.  . vortioxetine HBr (TRINTELLIX) 10 MG TABS tablet Take 10 mg by mouth daily.  . Wheat Dextrin (BENEFIBER DRINK MIX) PACK Take 4 g by mouth at bedtime.   No facility-administered encounter medications on file as of 05/17/2018.      Objective: Blood pressure 133/71, pulse 83, temperature 98.5 F (36.9 C), temperature source Oral, resp. rate 18, height 5\' 1"  (1.549 m), weight 150 lb 1.6 oz (68.1 kg).  Patient is alert and in no acute distress. Conjunctiva is pink. Sclera is nonicteric Oropharyngeal mucosa is normal. No neck masses or thyromegaly noted. Cardiac exam with regular rhythm normal S1 and S2.  Faint systolic murmur noted at left sternal border. Lungs are clear to auscultation. Abdomen is symmetrical.  Bowel sounds are normal.  On palpation abdomen is soft.  She has mild tenderness in left lower quadrant without guarding.  No organomegaly or masses. Rectal examination reveals normal sphincter tone.  She is able to squeeze her sphincter quite well but instructed to do so. Stool is guaiac negative. No LE edema or clubbing noted.   Assessment:  #1.  GERD.  She is still having intermittent heartburn but no alarm symptoms.  She  will continue PPI at double dose for now.  #2.  Chronic constipation.  She is still not having desirable results.  She would benefit from using Dulcolax suppository on as-needed basis.  She needs to make sure that she has at least 3 bowel movements per week.  #3.  Weight loss.  Weight loss has stopped.  She has gained 2 pounds since her last visit.  Weight loss most likely related to her depression which seems to be better controlled.  #4.  Fecal seepage.  She has good anal sphincter tone.  She needs to back off on the dose of polyethylene glycol.  She may also benefit from changing her fiber from water-soluble to non-water-soluble.   She also would benefit from Kegel exercise.   Plan:  Discontinue Benefiber. Begin Metamucil 4 g by mouth daily at bedtime. Decrease polyethylene glycol dose to half to three fourths of a scoop daily. Use Dulcolax suppository if you go more than 1 day without a bowel movement. Kegel exercise 2-3 times a day as directed. Patient will call with progress report in 1 week regarding fecal seepage. Office visit in 6 months.

## 2018-05-17 NOTE — Patient Instructions (Signed)
Please call office with progress report in 1 month. You need to make sure that she have at least 3 bowel movements per week. Use Dulcolax suppository as discussed.

## 2018-10-04 ENCOUNTER — Other Ambulatory Visit (INDEPENDENT_AMBULATORY_CARE_PROVIDER_SITE_OTHER): Payer: Self-pay | Admitting: Internal Medicine

## 2018-11-15 ENCOUNTER — Ambulatory Visit (INDEPENDENT_AMBULATORY_CARE_PROVIDER_SITE_OTHER): Payer: Medicare Other | Admitting: Internal Medicine

## 2018-11-15 ENCOUNTER — Other Ambulatory Visit: Payer: Self-pay

## 2018-11-15 ENCOUNTER — Encounter (INDEPENDENT_AMBULATORY_CARE_PROVIDER_SITE_OTHER): Payer: Self-pay | Admitting: Internal Medicine

## 2018-11-15 VITALS — BP 143/72 | HR 77 | Temp 98.1°F | Resp 18 | Ht 61.0 in | Wt 151.2 lb

## 2018-11-15 DIAGNOSIS — K219 Gastro-esophageal reflux disease without esophagitis: Secondary | ICD-10-CM | POA: Diagnosis not present

## 2018-11-15 DIAGNOSIS — K5900 Constipation, unspecified: Secondary | ICD-10-CM

## 2018-11-15 MED ORDER — LINACLOTIDE 72 MCG PO CAPS: 72.0000 ug | ORAL_CAPSULE | Freq: Every day | ORAL | 5 refills | Status: DC

## 2018-11-15 NOTE — Patient Instructions (Signed)
Increase intake of fiber rich foods as discussed but watch intake of foods like pinto beans and Brussels sprouts as these foods will cause more gas and bloating. Patient given diet sheet. Please call office with progress report in 2 weeks.

## 2018-11-15 NOTE — Progress Notes (Signed)
Presenting complaint;  Follow-up for constipation and GERD.  Database and subjective:  Patient is 85 year old Caucasian female who is here for scheduled visit.  She has chronic constipation GERD and she also has been losing weight.  She was last seen on 05/17/2018 when she weighed 150 pounds. She states her appetite is some better.  She has not lost any weight since her last visit.  In fact her weight is up by 1 pound. She feels that she is doing better with double dose PPI.  She is still having intermittent breakthrough heartburn.  She says yesterday she ate hamburger from [employer] and heartburn most of the afternoon.  She says in the past when she has skip the evening dose she has breakthrough symptoms and lasted a day.  She denies nausea vomiting or dysphagia. She continues to complain of constipation.  She says her bowels move on most days but she rarely has what she would describe a normal bowel movement or good evacuation.  She had a bowel movement this morning and she only passed small bits and pieces of stool.  She feels polyethylene glycol is not working anymore.  She has tried using suppository but she says it does not work.  She states she was tried on Linzess by Dr. Woody Seller few years ago and she developed diarrhea.  She does not remember if she took to 90 or 145 mcg.   Current Medications: Outpatient Encounter Medications as of 11/15/2018  Medication Sig  . amLODipine (NORVASC) 5 MG tablet Take 5 mg by mouth daily.  Marland Kitchen aspirin EC 81 MG tablet Take 81 mg by mouth daily.  . cetirizine (ZYRTEC) 10 MG tablet Take 10 mg by mouth as needed for allergies.  . DULoxetine (CYMBALTA) 20 MG capsule Take 20 mg by mouth daily.  Marland Kitchen gabapentin (NEURONTIN) 100 MG capsule Take 200 mg by mouth at bedtime.  . hydrochlorothiazide (HYDRODIURIL) 25 MG tablet Take 25 mg by mouth daily.  . hydroxychloroquine (PLAQUENIL) 200 MG tablet Take 200 mg by mouth daily.   Marland Kitchen levothyroxine (SYNTHROID) 88 MCG tablet Take 88  mcg by mouth daily before breakfast.   . losartan (COZAAR) 100 MG tablet Take 100 mg by mouth daily.  . pantoprazole (PROTONIX) 40 MG tablet TAKE ONE TABLET BY MOUTH TWICE DAILY. TAKE BEFORE A MEAL.  Marland Kitchen polyethylene glycol (MIRALAX / GLYCOLAX) packet Take 8.5 g by mouth daily.  . potassium chloride (K-DUR) 10 MEQ tablet Take 10 mEq by mouth daily.  . psyllium (METAMUCIL SMOOTH TEXTURE) 58.6 % powder Take 1 packet by mouth daily.  . simvastatin (ZOCOR) 20 MG tablet Take 20 mg by mouth at bedtime.  . bisacodyl (DULCOLAX) 10 MG suppository Place 1 suppository (10 mg total) rectally as needed for moderate constipation. (Patient not taking: Reported on 11/15/2018)  . [DISCONTINUED] clonazePAM (KLONOPIN) 0.5 MG tablet Take 0.5 mg by mouth 2 (two) times daily as needed for anxiety.  . [DISCONTINUED] vortioxetine HBr (TRINTELLIX) 10 MG TABS tablet Take 10 mg by mouth daily.   No facility-administered encounter medications on file as of 11/15/2018.      Objective: Blood pressure (!) 143/72, pulse 77, temperature 98.1 F (36.7 C), temperature source Oral, resp. rate 18, height 5\' 1"  (1.549 m), weight 151 lb 3.2 oz (68.6 kg). Patient is alert and in no acute distress. Conjunctiva is pink. Sclera is nonicteric Oropharyngeal mucosa is normal. She has upper dental plate in place.  She has few remaining teeth and lower jaw. No neck masses or  thyromegaly noted. Cardiac exam with regular rhythm normal S1 and S2. No murmur or gallop noted. Lungs are clear to auscultation. Abdomen is symmetrical.  She has low midline scar.  Bowel sounds are normal.  On palpation she has tenderness on superficial palpation across the lower abdomen.  She has mild tenderness on deep palpation at LLQ and epigastric region.  No organomegaly or masses. No LE edema or clubbing noted.   Assessment:  #1.  Chronic GERD.  She is doing well with double dose PPI.  She is still having intermittent symptoms despite dietary measures.   Previous attempts at decreasing PPI dose have not worked.  She will continue present dose for now.  #2.  Chronic constipation.  She is not having desired results with polyethylene glycol and dietary measures.  Will try her on low-dose Linzess/linaclotide.  #3.  Weight loss.  Weight  has leveled off.   Plan:  Discontinue polyethylene glycol. Linzess 72 mcg by mouth every morning.  2-week supply given along with a prescription. If this medication works she can fill up the prescription and hopefully would be covered.  If not we will look for alternatives. Patient will call with progress report in 2 weeks. Office visit in 6 months.

## 2018-11-29 ENCOUNTER — Telehealth (INDEPENDENT_AMBULATORY_CARE_PROVIDER_SITE_OTHER): Payer: Self-pay | Admitting: *Deleted

## 2018-11-29 NOTE — Telephone Encounter (Signed)
Per Dr.Rehman the patient may add 1/2 capful of Miralax daily, in addition to her taking the Linzess 72 mcg daily. Patient called and made aware.

## 2018-11-29 NOTE — Telephone Encounter (Signed)
Patient called and states that the Linzess 72 mcg is not working good. She has had 1 bowel movement but not a good one. She did take her fiber this morning (11/28/18). She is asking if she needs something else or should she go back in addition to her Linzess , the Miralax?

## 2019-02-14 ENCOUNTER — Telehealth (INDEPENDENT_AMBULATORY_CARE_PROVIDER_SITE_OTHER): Payer: Self-pay | Admitting: Internal Medicine

## 2019-02-14 NOTE — Telephone Encounter (Signed)
Patient called stated she has been taking Protonix for quite some time and is needing a refill which she only takes every now and then - wants to know if she needs to be put on something else - please advise  -  Ph# [phone]

## 2019-02-17 NOTE — Telephone Encounter (Signed)
After talking with the patient , she states that she has got a refill and she will take from now till her appointment in January, She will call our office if there are any issues.

## 2019-02-17 NOTE — Telephone Encounter (Signed)
Patient has OV in July, 2020. At this time patient stated that she was doing good taking 2 Protonix by mouth daily before a meal. She states that she is only taking it every now and then. Patient called and suggested that she take it as directed for 2 weeks , then call our office and we will see if there is another PPI that she can try.  Refill sent to the patients' pharmacy.

## 2019-05-19 DIAGNOSIS — Z299 Encounter for prophylactic measures, unspecified: Secondary | ICD-10-CM | POA: Diagnosis not present

## 2019-05-19 DIAGNOSIS — M461 Sacroiliitis, not elsewhere classified: Secondary | ICD-10-CM | POA: Diagnosis not present

## 2019-05-19 DIAGNOSIS — I10 Essential (primary) hypertension: Secondary | ICD-10-CM | POA: Diagnosis not present

## 2019-05-23 ENCOUNTER — Other Ambulatory Visit: Payer: Self-pay

## 2019-05-23 ENCOUNTER — Ambulatory Visit (INDEPENDENT_AMBULATORY_CARE_PROVIDER_SITE_OTHER): Payer: Medicare Other | Admitting: Internal Medicine

## 2019-05-23 ENCOUNTER — Encounter (INDEPENDENT_AMBULATORY_CARE_PROVIDER_SITE_OTHER): Payer: Self-pay | Admitting: Internal Medicine

## 2019-05-23 VITALS — BP 124/67 | HR 78 | Temp 97.2°F | Ht 61.0 in | Wt 146.5 lb

## 2019-05-23 DIAGNOSIS — K5900 Constipation, unspecified: Secondary | ICD-10-CM | POA: Diagnosis not present

## 2019-05-23 DIAGNOSIS — K219 Gastro-esophageal reflux disease without esophagitis: Secondary | ICD-10-CM

## 2019-05-23 DIAGNOSIS — R634 Abnormal weight loss: Secondary | ICD-10-CM

## 2019-05-23 NOTE — Progress Notes (Signed)
Presenting complaint;  Follow-up for GERD chronic constipation and weight loss.  Database and subjective:  Patient is 86 year old Caucasian female who is here for scheduled visit.  She was last seen 6 months ago.  She lives alone.  She states she has 4 sisters 3 of whom live in [place] and [place] but she does not get to see them or spend time with them often.  She states she spent 3 weeks with her daughter in [place] and had a good time.  She says her daughter and son-in-law are now moving to [place].  She says of cost of living was not high she would like to move closer to her daughter.  She says as well as GERD symptoms are concerned she is doing well.  She only has heartburn with certain foods.  She also complains of intermittent bloating but denies nausea or vomiting.  She is using polyethylene glycol on as-needed basis.  She says at times she has difficulty expelling stool from the rectum.  She says stool is soft and not hard.  She also complains of having black stools few weeks ago but is cleared.  She denies epigastric pain.  She also denies dysphagia.  She has hoarseness which comes and goes.  She has a history of thyroid cancer and underwent 3 surgeries and she also had reviewed radioiodine ablation.  Most recent surgery was in 1980. Patient states new medication is helping with her depression.   Current Medications: Outpatient Encounter Medications as of 05/23/2019  Medication Sig  . amLODipine (NORVASC) 5 MG tablet Take 5 mg by mouth daily.  Marland Kitchen aspirin EC 81 MG tablet Take 81 mg by mouth daily.  . cetirizine (ZYRTEC) 10 MG tablet Take 10 mg by mouth as needed for allergies.  . citalopram (CELEXA) 20 MG tablet Take 20 mg by mouth daily.  Marland Kitchen gabapentin (NEURONTIN) 100 MG capsule Take 200 mg by mouth at bedtime.  . hydrochlorothiazide (HYDRODIURIL) 25 MG tablet Take 25 mg by mouth daily.  . hydroxychloroquine (PLAQUENIL) 200 MG tablet Take 200 mg by mouth daily.   Marland Kitchen levothyroxine  (SYNTHROID) 88 MCG tablet Take 88 mcg by mouth daily before breakfast.   . losartan (COZAAR) 100 MG tablet Take 100 mg by mouth daily.  . pantoprazole (PROTONIX) 40 MG tablet TAKE ONE TABLET BY MOUTH TWICE DAILY. TAKE BEFORE A MEAL.  Marland Kitchen polyethylene glycol (MIRALAX / GLYCOLAX) 17 g packet Take 17 g by mouth daily.  . potassium chloride (K-DUR) 10 MEQ tablet Take 10 mEq by mouth daily.  . psyllium (METAMUCIL SMOOTH TEXTURE) 58.6 % powder Take 1 packet by mouth daily.  . simvastatin (ZOCOR) 20 MG tablet Take 20 mg by mouth at bedtime.  . vortioxetine HBr (TRINTELLIX) 10 MG TABS tablet Take 10 mg by mouth daily.  . bisacodyl (DULCOLAX) 10 MG suppository Place 1 suppository (10 mg total) rectally as needed for moderate constipation. (Patient not taking: Reported on 11/15/2018)  . linaclotide (LINZESS) 72 MCG capsule Take 1 capsule (72 mcg total) by mouth daily before breakfast. (Patient not taking: Reported on 05/23/2019)  . [DISCONTINUED] DULoxetine (CYMBALTA) 20 MG capsule Take 20 mg by mouth daily.   No facility-administered encounter medications on file as of 05/23/2019.     Objective: Blood pressure 124/67, pulse 78, temperature (!) 97.2 F (36.2 C), temperature source Temporal, height 5\' 1"  (1.549 m), weight 146 lb 8 oz (66.5 kg). Patient is alert and in no acute distress. She is wearing a mask. Conjunctiva  is pink. Sclera is nonicteric Oropharyngeal mucosa is normal. No neck masses or thyromegaly noted. Cardiac exam with regular rhythm normal S1 and S2. No murmur or gallop noted. Lungs are clear to auscultation. Abdomen is symmetrical soft and nontender with organomegaly or masses. No LE edema or clubbing noted.  Assessment:  #1.  Chronic GERD.  She is doing well with therapy.  May consider changing dose at a later date but not during this difficult time with Covid-19 pandemic ongoing.  #2.  Chronic constipation.  Patient advised to take polyethylene glycol daily or every other day  rather than on a as needed basis.  Since she is having difficulty expelling even soft stool I am concerned that she may have a rectocele.  If it is a large rectocele she will need to have it repaired.  #3.  Weight loss.  She has lost another 5 pounds since her last visit.  I feel weight loss is due to diminished calorie intake and the fact that she lives alone.  We will continue to monitor her weight.  Plan:  Medication list updated. Hemoccult if stool noted to be dark or black. Patient advised to make an appointment to see Ms. Nicanor Bake, NP or Ms. Arsenio Katz, PA-C for pelvic examination to rule out rectocele. Patient advised to use Dulcolax suppository if she goes more than 1 day without a bowel movement. Weight check in 2 months. Office visit in 6 months.

## 2019-05-23 NOTE — Patient Instructions (Addendum)
Please make an appointment to see Ms. Nicole Simon or Ms. Nicole Simon for pelvic examination to check for rectocele. Can use Dulcolax suppository if you go more than 1 day without a bowel movement. Hemoccult x1 if you notice black stools again.

## 2019-05-30 DIAGNOSIS — Z789 Other specified health status: Secondary | ICD-10-CM | POA: Diagnosis not present

## 2019-05-30 DIAGNOSIS — N816 Rectocele: Secondary | ICD-10-CM | POA: Diagnosis not present

## 2019-05-30 DIAGNOSIS — I10 Essential (primary) hypertension: Secondary | ICD-10-CM | POA: Diagnosis not present

## 2019-05-30 DIAGNOSIS — Z299 Encounter for prophylactic measures, unspecified: Secondary | ICD-10-CM | POA: Diagnosis not present

## 2019-05-31 ENCOUNTER — Telehealth (INDEPENDENT_AMBULATORY_CARE_PROVIDER_SITE_OTHER): Payer: Self-pay | Admitting: *Deleted

## 2019-05-31 NOTE — Telephone Encounter (Signed)
Nicole Simon called in and wanted you to know she did see Ms. Laughridge --did pelvic --sent her to GYN--she has Rectocele.

## 2019-06-04 NOTE — Telephone Encounter (Signed)
Ms. Nicole Simon note appreciated

## 2019-08-08 DIAGNOSIS — Z299 Encounter for prophylactic measures, unspecified: Secondary | ICD-10-CM | POA: Diagnosis not present

## 2019-08-08 DIAGNOSIS — Z789 Other specified health status: Secondary | ICD-10-CM | POA: Diagnosis not present

## 2019-08-08 DIAGNOSIS — I10 Essential (primary) hypertension: Secondary | ICD-10-CM | POA: Diagnosis not present

## 2019-08-24 DIAGNOSIS — Z299 Encounter for prophylactic measures, unspecified: Secondary | ICD-10-CM | POA: Diagnosis not present

## 2019-08-24 DIAGNOSIS — I10 Essential (primary) hypertension: Secondary | ICD-10-CM | POA: Diagnosis not present

## 2019-08-24 DIAGNOSIS — E039 Hypothyroidism, unspecified: Secondary | ICD-10-CM | POA: Diagnosis not present

## 2019-09-05 DIAGNOSIS — M545 Low back pain: Secondary | ICD-10-CM | POA: Diagnosis not present

## 2019-09-05 DIAGNOSIS — M154 Erosive (osteo)arthritis: Secondary | ICD-10-CM | POA: Diagnosis not present

## 2019-09-05 DIAGNOSIS — M2550 Pain in unspecified joint: Secondary | ICD-10-CM | POA: Diagnosis not present

## 2019-09-05 DIAGNOSIS — M064 Inflammatory polyarthropathy: Secondary | ICD-10-CM | POA: Diagnosis not present

## 2019-09-05 DIAGNOSIS — M150 Primary generalized (osteo)arthritis: Secondary | ICD-10-CM | POA: Diagnosis not present

## 2019-09-06 DIAGNOSIS — Z79899 Other long term (current) drug therapy: Secondary | ICD-10-CM | POA: Diagnosis not present

## 2019-09-06 DIAGNOSIS — Z1211 Encounter for screening for malignant neoplasm of colon: Secondary | ICD-10-CM | POA: Diagnosis not present

## 2019-09-06 DIAGNOSIS — Z299 Encounter for prophylactic measures, unspecified: Secondary | ICD-10-CM | POA: Diagnosis not present

## 2019-09-06 DIAGNOSIS — Z0000 Encounter for general adult medical examination without abnormal findings: Secondary | ICD-10-CM | POA: Diagnosis not present

## 2019-09-06 DIAGNOSIS — E039 Hypothyroidism, unspecified: Secondary | ICD-10-CM | POA: Diagnosis not present

## 2019-09-06 DIAGNOSIS — R5383 Other fatigue: Secondary | ICD-10-CM | POA: Diagnosis not present

## 2019-09-06 DIAGNOSIS — Z7189 Other specified counseling: Secondary | ICD-10-CM | POA: Diagnosis not present

## 2019-09-06 DIAGNOSIS — I10 Essential (primary) hypertension: Secondary | ICD-10-CM | POA: Diagnosis not present

## 2019-09-06 DIAGNOSIS — E7800 Pure hypercholesterolemia, unspecified: Secondary | ICD-10-CM | POA: Diagnosis not present

## 2019-10-24 ENCOUNTER — Other Ambulatory Visit (INDEPENDENT_AMBULATORY_CARE_PROVIDER_SITE_OTHER): Payer: Self-pay | Admitting: Internal Medicine

## 2019-11-21 ENCOUNTER — Ambulatory Visit (INDEPENDENT_AMBULATORY_CARE_PROVIDER_SITE_OTHER): Payer: Medicare Other | Admitting: Internal Medicine

## 2019-12-04 DIAGNOSIS — Z961 Presence of intraocular lens: Secondary | ICD-10-CM | POA: Diagnosis not present

## 2019-12-04 DIAGNOSIS — Z79899 Other long term (current) drug therapy: Secondary | ICD-10-CM | POA: Diagnosis not present

## 2019-12-04 DIAGNOSIS — H524 Presbyopia: Secondary | ICD-10-CM | POA: Diagnosis not present

## 2019-12-08 DIAGNOSIS — I10 Essential (primary) hypertension: Secondary | ICD-10-CM | POA: Diagnosis not present

## 2019-12-08 DIAGNOSIS — Z299 Encounter for prophylactic measures, unspecified: Secondary | ICD-10-CM | POA: Diagnosis not present

## 2019-12-08 DIAGNOSIS — E039 Hypothyroidism, unspecified: Secondary | ICD-10-CM | POA: Diagnosis not present

## 2019-12-08 DIAGNOSIS — E7800 Pure hypercholesterolemia, unspecified: Secondary | ICD-10-CM | POA: Diagnosis not present

## 2020-01-04 ENCOUNTER — Other Ambulatory Visit: Payer: Self-pay

## 2020-01-04 ENCOUNTER — Ambulatory Visit (INDEPENDENT_AMBULATORY_CARE_PROVIDER_SITE_OTHER): Payer: Medicare Other | Admitting: Gastroenterology

## 2020-01-04 ENCOUNTER — Encounter (INDEPENDENT_AMBULATORY_CARE_PROVIDER_SITE_OTHER): Payer: Self-pay | Admitting: Gastroenterology

## 2020-01-04 VITALS — BP 158/71 | HR 80 | Ht 61.0 in | Wt 141.6 lb

## 2020-01-04 DIAGNOSIS — R634 Abnormal weight loss: Secondary | ICD-10-CM | POA: Diagnosis not present

## 2020-01-04 DIAGNOSIS — K5909 Other constipation: Secondary | ICD-10-CM

## 2020-01-04 DIAGNOSIS — R140 Abdominal distension (gaseous): Secondary | ICD-10-CM | POA: Diagnosis not present

## 2020-01-04 DIAGNOSIS — K219 Gastro-esophageal reflux disease without esophagitis: Secondary | ICD-10-CM | POA: Diagnosis not present

## 2020-01-04 NOTE — Patient Instructions (Signed)
Try protonix twice a day 30 min before meals - try to eat a little something for breakfast each morning then take 30 min before dinner instead of at bed time. Will get labs from PCP.

## 2020-01-04 NOTE — Progress Notes (Signed)
Patient profile: Nicole Simon is a 86 y.o. female seen for follow-up, last seen for GERD constipation & weight loss.  Patient was recommended to use Dulcolax suppository if goes more than 1 day prior to bowel movement.  Continued on GERD therapy and recommended to monitor weight.  Seen today for follow-up   History of Present Illness: Nicole Simon is seen today for follow-up.  Reports her pain doing fairly well, she takes MiraLAX daily.  Typically daily bowel movement.  She does sometimes have some difficulty starting defecation.  She feels that her muscles are hard to start having a bowel movement.  This is worse when she misses MiraLAX occasionally.  She denies any rectal bleeding or melena.  She reports having reflux symptoms daily despite Protonix twice a day.  Feels her worse after lunch. currently taking Protonix before lunch and before bed. Typically doesn't eat anything for breakfast, eats lunch around noon, dinner around 6pm.  She denies any dysphagia.  She does have some nausea-this seems to be worse in the mornings.  No significant epigastric pain  We discussed her weight in detail.  She reports frequently not getting hungry-eats "because I know I have to".  She does also endorse some early satiety.     She denies NSAIDs, tobacco, alcohol.  Wt Readings from Last 3 Encounters:  01/04/20 141 lb 9.6 oz (64.2 kg)  05/23/19 146 lb 8 oz (66.5 kg)  11/15/18 151 lb 3.2 oz (68.6 kg)     Last Colonoscopy: 2018-- Diverticulosis in the sigmoid colon. - Anal papilla(e) were hypertrophied. - No specimens collected.  Last Endoscopy: 2018-- Z-line regular, 30 cm from the incisors. - 6 cm hiatal hernia. - A few gastric polyps. These appear to be hyperplastic polyps and were left alone. - Erosive gastropathy. - No specimens collected.   Past Medical History:  Past Medical History:  Diagnosis Date   Arthritis    Depression    GERD (gastroesophageal reflux disease)    Hypertension     Hypothyroidism    Peripheral neuropathy     Problem List: Patient Active Problem List   Diagnosis Date Noted   Loss of weight 05/23/2019   Hx of colonic polyps 12/02/2016   Iron deficiency anemia 12/02/2016   GERD (gastroesophageal reflux disease) 06/11/2014   Constipation 06/11/2014   History of colonic polyps 06/11/2014   Hypertension 04/11/2012   High cholesterol 04/11/2012   Hypothyroid 04/11/2012    Past Surgical History: Past Surgical History:  Procedure Laterality Date   ABDOMINAL HYSTERECTOMY     APPENDECTOMY     BACK SURGERY     COLONOSCOPY     COLONOSCOPY  02/17/2012   Procedure: COLONOSCOPY;  Surgeon: Rogene Houston, MD;  [facility];  Service: Endoscopy;  Laterality: N/A;  1030   COLONOSCOPY N/A 10/05/2014   Procedure: COLONOSCOPY;  Surgeon: Rogene Houston, MD;  [facility];  Service: Endoscopy;  Laterality: N/A;  855   COLONOSCOPY N/A 12/11/2016   Procedure: COLONOSCOPY;  Surgeon: Rogene Houston, MD;  [facility];  Service: Endoscopy;  Laterality: N/A;  [place] N/A 10/05/2014   Procedure: ESOPHAGEAL DILATION;  Surgeon: Rogene Houston, MD;  [facility];  Service: Endoscopy;  Laterality: N/A;   ESOPHAGOGASTRODUODENOSCOPY N/A 10/05/2014   Procedure: ESOPHAGOGASTRODUODENOSCOPY (EGD);  Surgeon: Rogene Houston, MD;  [facility];  Service: Endoscopy;  Laterality: N/A;  ESOPHAGOGASTRODUODENOSCOPY N/A 12/11/2016   Procedure: ESOPHAGOGASTRODUODENOSCOPY (EGD);  Surgeon: Rogene Houston, MD;  [facility];  Service: Endoscopy;  Laterality: N/A;   THYROIDECTOMY      Allergies: Allergies  Allergen Reactions   Penicillins Hives and Itching    Hives Has patient had a PCN reaction causing immediate rash, facial/tongue/throat swelling, SOB or lightheadedness with hypotension:Yes Has patient had a PCN reaction causing severe  rash involving mucus membranes or skin necrosis:No Has patient had a PCN reaction that required hospitalization:No Has patient had a PCN reaction occurring within the last 10 years:No If all of the above answers are "NO", then may proceed with Cephalosporin use.       Home Medications:  Current Outpatient Medications:    amLODipine (NORVASC) 5 MG tablet, Take 5 mg by mouth daily., Disp: , Rfl:    aspirin EC 81 MG tablet, Take 81 mg by mouth daily., Disp: , Rfl:    cetirizine (ZYRTEC) 10 MG tablet, Take 10 mg by mouth as needed for allergies., Disp: , Rfl:    gabapentin (NEURONTIN) 100 MG capsule, Take 200 mg by mouth at bedtime., Disp: , Rfl: 12   hydrochlorothiazide (HYDRODIURIL) 25 MG tablet, Take 25 mg by mouth daily., Disp: , Rfl:    hydroxychloroquine (PLAQUENIL) 200 MG tablet, Take 200 mg by mouth daily. , Disp: , Rfl:    levothyroxine (SYNTHROID) 88 MCG tablet, Take 88 mcg by mouth daily before breakfast. , Disp: , Rfl:    losartan (COZAAR) 100 MG tablet, Take 100 mg by mouth daily., Disp: , Rfl:    pantoprazole (PROTONIX) 40 MG tablet, TAKE ONE TABLET BY MOUTH TWICE DAILY. TAKE BEFORE A MEAL., Disp: 180 tablet, Rfl: 3   polyethylene glycol (MIRALAX / GLYCOLAX) 17 g packet, Take 17 g by mouth daily., Disp: , Rfl:    potassium chloride (K-DUR) 10 MEQ tablet, Take 10 mEq by mouth daily., Disp: , Rfl:    simvastatin (ZOCOR) 20 MG tablet, Take 20 mg by mouth at bedtime., Disp: , Rfl:    vortioxetine HBr (TRINTELLIX) 10 MG TABS tablet, Take 10 mg by mouth daily., Disp: , Rfl:    bisacodyl (DULCOLAX) 10 MG suppository, Place 1 suppository (10 mg total) rectally as needed for moderate constipation. (Patient not taking: Reported on 11/15/2018), Disp: 12 suppository, Rfl: 0   citalopram (CELEXA) 20 MG tablet, Take 20 mg by mouth daily. (Patient not taking: Reported on 01/04/2020), Disp: , Rfl:    psyllium (METAMUCIL SMOOTH TEXTURE) 58.6 % powder, Take 1 packet by mouth daily.  (Patient not taking: Reported on 01/04/2020), Disp: 283 g, Rfl: 12   Family History:   Social History:   reports that she has never smoked. She has never used smokeless tobacco. She reports that she does not drink alcohol and does not use drugs.   Review of Systems: Constitutional: Denies weight loss/weight gain  Eyes: No changes in vision. ENT: No oral lesions, sore throat.  GI: see HPI.  Heme/Lymph: No easy bruising.  CV: No chest pain.  GU: No hematuria.  Integumentary: No rashes.  Neuro: No headaches.  Psych: No depression/anxiety.  Endocrine: No heat/cold intolerance.  Allergic/Immunologic: No urticaria.  Resp: No cough, SOB.  Musculoskeletal: No joint swelling.    Physical Examination: BP (!) 158/71    Pulse 80    Ht 5\' 1"  (1.549 m)    Wt 141 lb 9.6 oz (64.2 kg)    BMI 26.76 kg/m  Gen: NAD, alert and oriented x  4 HEENT: PEERLA, EOMI, Neck: supple, no JVD Chest: CTA bilaterally, no wheezes, crackles, or other adventitious sounds CV: RRR, no m/g/c/r Abd: soft, NT, ND, +BS in all four quadrants; no HSM, guarding, ridigity, or rebound tenderness Ext: no edema, well perfused with 2+ pulses, Skin: no rash or lesions noted on observed skin Lymph: no noted LAD  Data Reviewed:  CT a/p 12/2017 IMPRESSION: No acute abnormality abdomen or pelvis. Moderate stool burden ascending and transverse colon. Moderate to moderately large hiatal hernia. Atherosclerosis.   Requesting PCP labs    Assessment/Plan: Ms. Lapine is a 86 y.o. female seen for follow up.   1.  GERD-we discussed PPI may be more beneficial if taking before meals instead of after meals.  She does have a moderate to large hiatal hernia that is likely worsening her symptoms.  She is fairly compliant with diet and tries to avoid trigger foods and was on you, spaghetti, lettuce, etc.  She is on PPI twice a day.  Would consider trial of Dexilant.  2.  Weight loss-we will request her most recent labs from PCP.  She  reports they monitor her thyroid as well.  3.  Constipation-well-controlled with MiraLAX.  She is not having to use the suppositories frequently.  She had a pelvic exam by GYN in [facility] per report without evidence of rectocele.  4. Bloating - worse w/ certain food triggers.  We discussed trial of Phazyme over-the-counter.   We will discuss her weight loss with Dr. Laural Golden.  Dorethia was seen today for follow-up.  Diagnoses and all orders for this visit:  Chronic GERD  Loss of weight  Chronic constipation  Bloating    F/up 6 months - to call sooner w/ issues. Will call patient after discussing w/ Dr Franklyn Lor discussed w/ Dr Laural Golden - will update labs with TSH and SED rate as well as schedule CT to evaluate large hiatal hernia particularly in setting of weight loss.    I personally performed the service, non-incident to. (WP)  Laurine Blazer, [facility]

## 2020-01-24 ENCOUNTER — Telehealth (INDEPENDENT_AMBULATORY_CARE_PROVIDER_SITE_OTHER): Payer: Self-pay | Admitting: Gastroenterology

## 2020-01-24 DIAGNOSIS — R1013 Epigastric pain: Secondary | ICD-10-CM

## 2020-01-24 DIAGNOSIS — K219 Gastro-esophageal reflux disease without esophagitis: Secondary | ICD-10-CM

## 2020-01-24 DIAGNOSIS — R6881 Early satiety: Secondary | ICD-10-CM

## 2020-01-24 NOTE — Telephone Encounter (Signed)
Received labs from pcp. tsh was normal. I called w/ results - still having early satiety, and bad reflex - will order CT as discussed at OV

## 2020-01-25 NOTE — Telephone Encounter (Signed)
Yes ma'am! 

## 2020-01-25 NOTE — Telephone Encounter (Signed)
Patient will need I-STAT creatinine CT sch'd 02/16/20 at 5 pm (445), npo 4 hours prior, pick up contrast -- patient  aware

## 2020-01-25 NOTE — Telephone Encounter (Signed)
Do I need to place order for iSTAT Cr? Thanks.

## 2020-01-26 NOTE — Telephone Encounter (Signed)
I stat cr order in chart. Thanks.

## 2020-02-16 ENCOUNTER — Other Ambulatory Visit: Payer: Self-pay

## 2020-02-16 ENCOUNTER — Ambulatory Visit (HOSPITAL_COMMUNITY)
Admission: RE | Admit: 2020-02-16 | Discharge: 2020-02-16 | Disposition: A | Discharge status: Home / Self Care | Payer: Medicare Other | Source: Ambulatory Visit | Attending: Gastroenterology | Admitting: Gastroenterology

## 2020-02-16 DIAGNOSIS — R1013 Epigastric pain: Secondary | ICD-10-CM | POA: Diagnosis not present

## 2020-02-16 DIAGNOSIS — K449 Diaphragmatic hernia without obstruction or gangrene: Secondary | ICD-10-CM | POA: Diagnosis not present

## 2020-02-16 DIAGNOSIS — I739 Peripheral vascular disease, unspecified: Secondary | ICD-10-CM | POA: Diagnosis not present

## 2020-02-16 DIAGNOSIS — R6881 Early satiety: Secondary | ICD-10-CM | POA: Insufficient documentation

## 2020-02-16 DIAGNOSIS — K219 Gastro-esophageal reflux disease without esophagitis: Secondary | ICD-10-CM | POA: Diagnosis not present

## 2020-02-16 DIAGNOSIS — I700 Atherosclerosis of aorta: Secondary | ICD-10-CM | POA: Diagnosis not present

## 2020-02-16 LAB — POCT I-STAT CREATININE: Creatinine, Ser: 0.9 mg/dL (ref 0.44–1.00)

## 2020-02-16 MED ORDER — IOHEXOL 300 MG/ML  SOLN
75.0000 mL | Freq: Once | INTRAMUSCULAR | Status: AC | PRN
  Administered 2020-02-16: 75 mL via INTRAVENOUS

## 2020-02-21 ENCOUNTER — Other Ambulatory Visit (INDEPENDENT_AMBULATORY_CARE_PROVIDER_SITE_OTHER): Payer: Self-pay | Admitting: Gastroenterology

## 2020-02-21 MED ORDER — DEXILANT 60 MG PO CPDR: 60.0000 mg | DELAYED_RELEASE_CAPSULE | Freq: Every day | ORAL | 1 refills | Status: DC

## 2020-02-26 ENCOUNTER — Other Ambulatory Visit (INDEPENDENT_AMBULATORY_CARE_PROVIDER_SITE_OTHER): Payer: Self-pay | Admitting: Gastroenterology

## 2020-02-26 DIAGNOSIS — R9389 Abnormal findings on diagnostic imaging of other specified body structures: Secondary | ICD-10-CM

## 2020-02-26 DIAGNOSIS — R1084 Generalized abdominal pain: Secondary | ICD-10-CM

## 2020-02-28 NOTE — Progress Notes (Signed)
CT Angio sch'd 03/25/20 at 9 am (830), npo 4 hours

## 2020-03-11 DIAGNOSIS — I10 Essential (primary) hypertension: Secondary | ICD-10-CM | POA: Diagnosis not present

## 2020-03-11 DIAGNOSIS — I700 Atherosclerosis of aorta: Secondary | ICD-10-CM | POA: Diagnosis not present

## 2020-03-11 DIAGNOSIS — H6981 Other specified disorders of Eustachian tube, right ear: Secondary | ICD-10-CM | POA: Diagnosis not present

## 2020-03-11 DIAGNOSIS — Z299 Encounter for prophylactic measures, unspecified: Secondary | ICD-10-CM | POA: Diagnosis not present

## 2020-03-25 ENCOUNTER — Ambulatory Visit (HOSPITAL_COMMUNITY)
Admission: RE | Admit: 2020-03-25 | Discharge: 2020-03-25 | Disposition: A | Discharge status: Home / Self Care | Payer: Medicare Other | Source: Ambulatory Visit | Attending: Gastroenterology | Admitting: Gastroenterology

## 2020-03-25 ENCOUNTER — Other Ambulatory Visit: Payer: Self-pay

## 2020-03-25 DIAGNOSIS — N281 Cyst of kidney, acquired: Secondary | ICD-10-CM | POA: Diagnosis not present

## 2020-03-25 DIAGNOSIS — I701 Atherosclerosis of renal artery: Secondary | ICD-10-CM | POA: Diagnosis not present

## 2020-03-25 DIAGNOSIS — R1084 Generalized abdominal pain: Secondary | ICD-10-CM | POA: Diagnosis not present

## 2020-03-25 DIAGNOSIS — K449 Diaphragmatic hernia without obstruction or gangrene: Secondary | ICD-10-CM | POA: Diagnosis not present

## 2020-03-25 DIAGNOSIS — R6881 Early satiety: Secondary | ICD-10-CM | POA: Diagnosis not present

## 2020-03-25 DIAGNOSIS — R9389 Abnormal findings on diagnostic imaging of other specified body structures: Secondary | ICD-10-CM | POA: Diagnosis not present

## 2020-03-25 LAB — POCT I-STAT CREATININE: Creatinine, Ser: 0.8 mg/dL (ref 0.44–1.00)

## 2020-03-25 MED ORDER — IOHEXOL 350 MG/ML SOLN
100.0000 mL | Freq: Once | INTRAVENOUS | Status: AC | PRN
  Administered 2020-03-25: 100 mL via INTRAVENOUS

## 2020-03-27 ENCOUNTER — Telehealth (INDEPENDENT_AMBULATORY_CARE_PROVIDER_SITE_OTHER): Payer: Self-pay

## 2020-03-27 NOTE — Telephone Encounter (Signed)
Dr. Jenetta Downer please page Dr Francena Hanly ( IR )  at [phone] in regards to Ms Humm before he schedules her an appointment, thanks

## 2020-03-27 NOTE — Telephone Encounter (Signed)
I spoke with Dr. Francena Hanly about the upcoming interventional radiology consult that was placed in the past for evaluation of the patient's abdominal pain.  Per discussion with Dr. Francena Hanly, there is only one vessel that has moderate stenosis but the other vessels are completely patent and this is not consistent with mesenteric ischemia.  He would like to know if there is any other reason why interventional radiology would need to be involved in the patient's care.  I will forward this message to Dr. Laural Golden (primary gastroenterologist) to discuss this further with Dr. Francena Hanly.  Maylon Peppers, MD Gastroenterology and Hepatology [facility]

## 2020-04-05 NOTE — Telephone Encounter (Signed)
Tammy please call patient and let her know that radiologist feels intestines is getting enough blood supply and no indication for angiography. She needs to make sure her constipation is not an issue.

## 2020-04-08 NOTE — Telephone Encounter (Signed)
Patient was called and message was left of Dr.Rehman's recommendations. Ask the patient to call our office back to address her constipation.

## 2020-04-09 ENCOUNTER — Telehealth (INDEPENDENT_AMBULATORY_CARE_PROVIDER_SITE_OTHER): Payer: Self-pay

## 2020-04-09 NOTE — Telephone Encounter (Signed)
I spoke with the patient and she is aware of all. She states she does still have some issues with constipation. She states she takes Miralax one capful every morning and every other night she takes a 1/2 capful. She states its like her gut is sluggish and the food stops moving through. She says that at time she thinks she can feel the food in a knot in her abdomin.   Tammy please call patient and let her know that radiologist feels intestines is getting enough blood supply and no indication for angiography. She needs to make sure her constipation is not an issue.

## 2020-04-10 NOTE — Telephone Encounter (Signed)
If she is still having constipation, she can increase her Miralax dosing to one cup every day. If still constipated after taking it for a week she can increase to 2 cups per day.

## 2020-04-10 NOTE — Telephone Encounter (Signed)
I spoke with the patient and she is aware of all. She states she does still have some issues with constipation. She states she takes Miralax one capful every morning and every other night she takes a 1/2 capful. She states its like her gut is sluggish and the food stops moving through. She says that at time she thinks she can feel the food in a knot in her abdomin.   Tammy please call patient and let her know that radiologist feels intestines is getting enough blood supply and no indication for angiography. She needs to make sure her constipation is not an issue.      Documentation

## 2020-04-10 NOTE — Telephone Encounter (Signed)
I called and left a message to r/c. 

## 2020-04-10 NOTE — Telephone Encounter (Signed)
I called and left a detailed message regarding the miralax.

## 2020-04-11 NOTE — Telephone Encounter (Signed)
I called and left a detailed message regarding the Miralax.

## 2020-04-12 ENCOUNTER — Encounter (INDEPENDENT_AMBULATORY_CARE_PROVIDER_SITE_OTHER): Payer: Self-pay

## 2020-04-12 NOTE — Telephone Encounter (Signed)
Patient is aware of all she states she was already taking one cup every day, she will increase the dose to two cups per day.

## 2020-05-17 DIAGNOSIS — M545 Low back pain, unspecified: Secondary | ICD-10-CM | POA: Diagnosis not present

## 2020-05-17 DIAGNOSIS — M154 Erosive (osteo)arthritis: Secondary | ICD-10-CM | POA: Diagnosis not present

## 2020-05-17 DIAGNOSIS — M2550 Pain in unspecified joint: Secondary | ICD-10-CM | POA: Diagnosis not present

## 2020-05-17 DIAGNOSIS — M150 Primary generalized (osteo)arthritis: Secondary | ICD-10-CM | POA: Diagnosis not present

## 2020-05-17 DIAGNOSIS — M064 Inflammatory polyarthropathy: Secondary | ICD-10-CM | POA: Diagnosis not present

## 2020-06-11 DIAGNOSIS — I700 Atherosclerosis of aorta: Secondary | ICD-10-CM | POA: Diagnosis not present

## 2020-06-11 DIAGNOSIS — I10 Essential (primary) hypertension: Secondary | ICD-10-CM | POA: Diagnosis not present

## 2020-06-11 DIAGNOSIS — J069 Acute upper respiratory infection, unspecified: Secondary | ICD-10-CM | POA: Diagnosis not present

## 2020-06-11 DIAGNOSIS — Z299 Encounter for prophylactic measures, unspecified: Secondary | ICD-10-CM | POA: Diagnosis not present

## 2020-07-04 ENCOUNTER — Ambulatory Visit (INDEPENDENT_AMBULATORY_CARE_PROVIDER_SITE_OTHER): Payer: Medicare Other | Admitting: Gastroenterology

## 2020-07-04 ENCOUNTER — Encounter (INDEPENDENT_AMBULATORY_CARE_PROVIDER_SITE_OTHER): Payer: Self-pay | Admitting: Gastroenterology

## 2020-07-04 ENCOUNTER — Other Ambulatory Visit: Payer: Self-pay

## 2020-07-04 VITALS — BP 148/78 | HR 92 | Temp 99.4°F | Ht 61.0 in | Wt 139.0 lb

## 2020-07-04 DIAGNOSIS — K581 Irritable bowel syndrome with constipation: Secondary | ICD-10-CM | POA: Diagnosis not present

## 2020-07-04 DIAGNOSIS — K219 Gastro-esophageal reflux disease without esophagitis: Secondary | ICD-10-CM | POA: Diagnosis not present

## 2020-07-04 DIAGNOSIS — D509 Iron deficiency anemia, unspecified: Secondary | ICD-10-CM | POA: Diagnosis not present

## 2020-07-04 DIAGNOSIS — K449 Diaphragmatic hernia without obstruction or gangrene: Secondary | ICD-10-CM | POA: Diagnosis not present

## 2020-07-04 DIAGNOSIS — K589 Irritable bowel syndrome without diarrhea: Secondary | ICD-10-CM | POA: Insufficient documentation

## 2020-07-04 MED ORDER — PEG 3350-KCL-NA BICARB-NACL 420 G PO SOLR: 4000.0000 mL | Freq: Once | ORAL | 0 refills | Status: AC

## 2020-07-04 MED ORDER — LINACLOTIDE 72 MCG PO CAPS: 72.0000 ug | ORAL_CAPSULE | Freq: Every day | ORAL | 1 refills | Status: DC

## 2020-07-04 MED ORDER — BACLOFEN 5 MG PO TABS: 1.0000 | ORAL_TABLET | Freq: Two times a day (BID) | ORAL | 1 refills | Status: DC

## 2020-07-04 NOTE — H&P (View-Only) (Signed)
Maylon Peppers, M.D. Gastroenterology & Hepatology [facility] [address] [place], [place] [address]  Primary Care Physician: Glenda Chroman, MD [place] [id number]  I will communicate my assessment and recommendations to the referring MD via EMR.  Problems: 1. IBS-C. 2. GERD 3. Large hiatal hernia  History of Present Illness: Nicole Simon is a 87 y.o. female with past medical history of IBS-C, GERD, depression, arthritis, hypertension, hypothyroidism and peripheral neuropathy, who presents for follow up of constipation, heartburn and frequent burping.  The patient was last seen on 01/04/2020. At that time, the patient was prescribed Dexilant but she had a high co-pay and could not afford it.  She was advised to continue MiraLAX.  The patient reports she is not feeling well as her "bowels have not been working well".  She takes one capful of Miralax a day which sometimes help for her bowel movements, as it makes her have bowel movements almost on a daily basis but if she does not have a BM she feels her stool "gets trapped in the left side of her abdomen" but causes diffuse abdominal pain. She feels nauseated frequently but does not vomit. She feels bloated frequently. Overall she feels better after having a bowel movement. She also reports burping frequently. Given persistent abdominal distention and discomfort, the patient underwent a CT angio abdomen and pelvis on 03/2020 with the following findings:  IMPRESSION: VASCULAR 1. 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 The visceral arteries are otherwise widely patent without CT findings of mesenteric ischemia. 2. Mild ostial stenosis of the right renal artery secondary to atherosclerotic plaque.  NON-VASCULAR 1. Similar appearing moderate sized hiatal hernia without evidence of strangulation. 2. Large colonic stool  burden. She reports having frequent heartburn in her chest every time she eats. She has been taking Protonix for multiple years twice a day. Has tried omeprazole and Nexium in the past but her symptoms have always recurred. Also tried Prevacid previously with improvement buy had high copay and could not continue taking the medication based on clinical notes.  The patient denies having any nausea, vomiting, fever, chills, hematochezia, melena, hematemesis, diarrhea, jaundice, pruritus. Has lost 2 lb since last appointment.  Last Colonoscopy: 2018-- Diverticulosis in the sigmoid colon. - Anal papilla(e) were hypertrophied. - No specimens collected.  Last Endoscopy: 2018-- Z-line regular, 30 cm from the incisors. - 6 cm hiatal hernia. - A few gastric polyps. These appear to be hyperplastic polyps and were left alone. - Erosive gastropathy. - No specimens collected.  Past Medical History: Past Medical History:  Diagnosis Date  . Arthritis   . Depression   . GERD (gastroesophageal reflux disease)   . Hypertension   . Hypothyroidism   . Peripheral neuropathy     Past Surgical History: Past Surgical History:  Procedure Laterality Date  . ABDOMINAL HYSTERECTOMY    . APPENDECTOMY    . BACK SURGERY    . COLONOSCOPY    . COLONOSCOPY  02/17/2012   Procedure: COLONOSCOPY;  Surgeon: Rogene Houston, MD;  [facility];  Service: Endoscopy;  Laterality: N/A;  1030  . COLONOSCOPY N/A 10/05/2014   Procedure: COLONOSCOPY;  Surgeon: Rogene Houston, MD;  [facility];  Service: Endoscopy;  Laterality: N/A;  855  . COLONOSCOPY N/A 12/11/2016   Procedure: COLONOSCOPY;  Surgeon: Rogene Houston, MD;  [facility];  Service: Endoscopy;  Laterality: N/A;  155  . DILATION AND CURETTAGE OF UTERUS    . ESOPHAGEAL DILATION N/A 10/05/2014   Procedure: ESOPHAGEAL DILATION;  Surgeon: Rogene Houston, MD;  [facility];  Service: Endoscopy;  Laterality: N/A;  .  ESOPHAGOGASTRODUODENOSCOPY N/A 10/05/2014   Procedure: ESOPHAGOGASTRODUODENOSCOPY (EGD);  Surgeon: Rogene Houston, MD;  [facility];  Service: Endoscopy;  Laterality: N/A;  . ESOPHAGOGASTRODUODENOSCOPY N/A 12/11/2016   Procedure: ESOPHAGOGASTRODUODENOSCOPY (EGD);  Surgeon: Rogene Houston, MD;  [facility];  Service: Endoscopy;  Laterality: N/A;  . THYROIDECTOMY      Family History: Family History  Problem Relation Age of Onset  . Colon cancer Neg Hx     Social History: Social History   Tobacco Use  Smoking Status Never Smoker  Smokeless Tobacco Never Used   Social History   Substance and Sexual Activity  Alcohol Use No  . Alcohol/week: 0.0 standard drinks   Social History   Substance and Sexual Activity  Drug Use No    Allergies: Allergies  Allergen Reactions  . Penicillins Hives and Itching    Hives Has patient had a PCN reaction causing immediate rash, facial/tongue/throat swelling, SOB or lightheadedness with hypotension:Yes Has patient had a PCN reaction causing severe rash involving mucus membranes or skin necrosis:No Has patient had a PCN reaction that required hospitalization:No Has patient had a PCN reaction occurring within the last 10 years:No If all of the above answers are "NO", then may proceed with Cephalosporin use.     Medications: Current Outpatient Medications  Medication Sig Dispense Refill  . amLODipine (NORVASC) 5 MG tablet Take 5 mg by mouth daily.    Marland Kitchen aspirin EC 81 MG tablet Take 81 mg by mouth daily.    . cetirizine (ZYRTEC) 10 MG tablet Take 10 mg by mouth as needed for allergies.    . citalopram (CELEXA) 20 MG tablet Take 20 mg by mouth daily.    Marland Kitchen gabapentin (NEURONTIN) 100 MG capsule Take 100 mg by mouth at bedtime.  12  . hydroxychloroquine (PLAQUENIL) 200 MG tablet Take 200 mg by mouth daily.     Marland Kitchen levothyroxine (SYNTHROID) 88 MCG tablet Take 88 mcg by mouth daily before breakfast.     . losartan (COZAAR)  100 MG tablet Take 100 mg by mouth daily.    . pantoprazole (PROTONIX) 40 MG tablet TAKE ONE TABLET BY MOUTH TWICE DAILY. TAKE BEFORE A MEAL. 180 tablet 3  . polyethylene glycol (MIRALAX / GLYCOLAX) 17 g packet Take 17 g by mouth daily.    . potassium chloride (K-DUR) 10 MEQ tablet Take 10 mEq by mouth daily.    . simvastatin (ZOCOR) 20 MG tablet Take 20 mg by mouth at bedtime.    . vortioxetine HBr (TRINTELLIX) 10 MG TABS tablet Take 10 mg by mouth daily.    . psyllium (METAMUCIL SMOOTH TEXTURE) 58.6 % powder Take 1 packet by mouth daily. (Patient not taking: No sig reported) 283 g 12   No current facility-administered medications for this visit.    Review of Systems: GENERAL: negative for malaise, night sweats HEENT: No changes in hearing or vision, no nose bleeds or other nasal problems. NECK: Negative for lumps, goiter, pain and significant neck swelling RESPIRATORY: Negative for cough, wheezing CARDIOVASCULAR: Negative for chest pain, leg swelling, palpitations, orthopnea GI: SEE HPI MUSCULOSKELETAL: Negative for joint pain or swelling, back pain, and muscle pain. SKIN: Negative for lesions, rash PSYCH: Negative for sleep disturbance, mood disorder and recent psychosocial  stressors. HEMATOLOGY Negative for prolonged bleeding, bruising easily, and swollen nodes. ENDOCRINE: Negative for cold or heat intolerance, polyuria, polydipsia and goiter. NEURO: negative for tremor, gait imbalance, syncope and seizures. The remainder of the review of systems is noncontributory.   Physical Exam: BP (!) 148/78 (BP Location: Left Arm, Patient Position: Sitting, Cuff Size: Large)   Pulse 92   Temp 99.4 F (37.4 C) (Oral)   Ht 5\' 1"  (1.549 m)   Wt 139 lb (63 kg)   BMI 26.26 kg/m  GENERAL: The patient is AO x3, in no acute distress. HEENT: Head is normocephalic and atraumatic. EOMI are intact. Mouth is well hydrated and without lesions. NECK: Supple. No masses LUNGS: Clear to auscultation.  No presence of rhonchi/wheezing/rales. Adequate chest expansion HEART: RRR, normal s1 and s2. ABDOMEN: Soft, nontender, no guarding, no peritoneal signs, and nondistended. BS +. No masses. EXTREMITIES: Without any cyanosis, clubbing, rash, lesions or edema. NEUROLOGIC: AOx3, no focal motor deficit. SKIN: no jaundice, no rashes  Imaging/Labs: as above  I personally reviewed and interpreted the available labs, imaging and endoscopic files.  Impression and Plan: Nicole Simon is a 87 y.o. female with past medical history of IBS-C, GERD, depression, arthritis, hypertension, hypothyroidism and peripheral neuropathy, who presents for follow up of constipation, heartburn and frequent burping.  In terms of her constipation and abdominal bloating, the patient has classical symptoms of IBS-C as she has relief of her symptoms when she is able to move her bowels.  Given her history of hypothyroidism, will check a TSH as a reversible cause of constipation, but I will prescribe a bowel prep for the patient after which she will need to start taking Linzess 72 mcg every day.  At that point she will need to stop taking her MiraLAX as this may be worsening her bloating.  Regarding her GERD, disease likely aggravated by the size of her hiatal hernia as it can be clearly seen that in her CT scan she has large amount of contents in the esophagus along with the formation of the esophageal body.  This explains why she has presented refractoriness to PPIs.  Given her age, patient is not interested in undergoing a surgical evaluation. Will try to implement the use of baclofen as a pharmacological measure to improve his symptoms  - Check TSH - Continue pantoprazole 40 mg every 12 hours - Start baclofen 5 mg twice a day - Take bowel prep during two days, after finishing it start Linzess 72 mcg every day - Stop Miralax - RTC 6 months  All questions were answered.      Harvel Quale, MD Gastroenterology and  Hepatology [facility]

## 2020-07-04 NOTE — Patient Instructions (Addendum)
Perform blood workup Continue pantoprazole 40 mg every 12 hours Start baclofen 5 mg twice a day Take bowel prep during two days, after finishing it start Linzess 72 mcg every day Stop Miralax

## 2020-07-04 NOTE — Progress Notes (Signed)
Nicole Simon, M.D. Gastroenterology & Hepatology [facility] [address] [place], [place] [address]  Primary Care Physician: Glenda Chroman, MD [place] [address]  I will communicate my assessment and recommendations to the referring MD via EMR.  Problems: 1. IBS-C. 2. GERD 3. Large hiatal hernia  History of Present Illness: Nicole Simon is a 87 y.o. female with past medical history of IBS-C, GERD, depression, arthritis, hypertension, hypothyroidism and peripheral neuropathy, who presents for follow up of constipation, heartburn and frequent burping.  The patient was last seen on 01/04/2020. At that time, the patient was prescribed Dexilant but she had a high co-pay and could not afford it.  She was advised to continue MiraLAX.  The patient reports she is not feeling well as her "bowels have not been working well".  She takes one capful of Miralax a day which sometimes help for her bowel movements, as it makes her have bowel movements almost on a daily basis but if she does not have a BM she feels her stool "gets trapped in the left side of her abdomen" but causes diffuse abdominal pain. She feels nauseated frequently but does not vomit. She feels bloated frequently. Overall she feels better after having a bowel movement. She also reports burping frequently. Given persistent abdominal distention and discomfort, the patient underwent a CT angio abdomen and pelvis on 03/2020 with the following findings:  IMPRESSION: VASCULAR 1. 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 The visceral arteries are otherwise widely patent without CT findings of mesenteric ischemia. 2. Mild ostial stenosis of the right renal artery secondary to atherosclerotic plaque.  NON-VASCULAR 1. Similar appearing moderate sized hiatal hernia without evidence of strangulation. 2. Large colonic stool  burden. She reports having frequent heartburn in her chest every time she eats. She has been taking Protonix for multiple years twice a day. Has tried omeprazole and Nexium in the past but her symptoms have always recurred. Also tried Prevacid previously with improvement buy had high copay and could not continue taking the medication based on clinical notes.  The patient denies having any nausea, vomiting, fever, chills, hematochezia, melena, hematemesis, diarrhea, jaundice, pruritus. Has lost 2 lb since last appointment.  Last Colonoscopy: 2018-- Diverticulosis in the sigmoid colon. - Anal papilla(e) were hypertrophied. - No specimens collected.  Last Endoscopy: 2018-- Z-line regular, 30 cm from the incisors. - 6 cm hiatal hernia. - A few gastric polyps. These appear to be hyperplastic polyps and were left alone. - Erosive gastropathy. - No specimens collected.  Past Medical History: Past Medical History:  Diagnosis Date  . Arthritis   . Depression   . GERD (gastroesophageal reflux disease)   . Hypertension   . Hypothyroidism   . Peripheral neuropathy     Past Surgical History: Past Surgical History:  Procedure Laterality Date  . ABDOMINAL HYSTERECTOMY    . APPENDECTOMY    . BACK SURGERY    . COLONOSCOPY    . COLONOSCOPY  02/17/2012   Procedure: COLONOSCOPY;  Surgeon: Rogene Houston, MD;  [facility];  Service: Endoscopy;  Laterality: N/A;  1030  . COLONOSCOPY N/A 10/05/2014   Procedure: COLONOSCOPY;  Surgeon: Rogene Houston, MD;  [facility]: [facility];  Service: Endoscopy;  Laterality: N/A;  855  . COLONOSCOPY N/A 12/11/2016   Procedure: COLONOSCOPY;  Surgeon: Rogene Houston, MD;  [facility]: [facility];  Service: Endoscopy;  Laterality: N/A;  155  . DILATION AND CURETTAGE OF UTERUS    . ESOPHAGEAL DILATION N/A 10/05/2014   Procedure: ESOPHAGEAL DILATION;  Surgeon: Rogene Houston, MD;  [facility]: [facility];  Service: Endoscopy;  Laterality: N/A;  .  ESOPHAGOGASTRODUODENOSCOPY N/A 10/05/2014   Procedure: ESOPHAGOGASTRODUODENOSCOPY (EGD);  Surgeon: Rogene Houston, MD;  [facility];  Service: Endoscopy;  Laterality: N/A;  . ESOPHAGOGASTRODUODENOSCOPY N/A 12/11/2016   Procedure: ESOPHAGOGASTRODUODENOSCOPY (EGD);  Surgeon: Rogene Houston, MD;  [facility];  Service: Endoscopy;  Laterality: N/A;  . THYROIDECTOMY      Family History: Family History  Problem Relation Age of Onset  . Colon cancer Neg Hx     Social History: Social History   Tobacco Use  Smoking Status Never Smoker  Smokeless Tobacco Never Used   Social History   Substance and Sexual Activity  Alcohol Use No  . Alcohol/week: 0.0 standard drinks   Social History   Substance and Sexual Activity  Drug Use No    Allergies: Allergies  Allergen Reactions  . Penicillins Hives and Itching    Hives Has patient had a PCN reaction causing immediate rash, facial/tongue/throat swelling, SOB or lightheadedness with hypotension:Yes Has patient had a PCN reaction causing severe rash involving mucus membranes or skin necrosis:No Has patient had a PCN reaction that required hospitalization:No Has patient had a PCN reaction occurring within the last 10 years:No If all of the above answers are "NO", then may proceed with Cephalosporin use.     Medications: Current Outpatient Medications  Medication Sig Dispense Refill  . amLODipine (NORVASC) 5 MG tablet Take 5 mg by mouth daily.    Marland Kitchen aspirin EC 81 MG tablet Take 81 mg by mouth daily.    . cetirizine (ZYRTEC) 10 MG tablet Take 10 mg by mouth as needed for allergies.    . citalopram (CELEXA) 20 MG tablet Take 20 mg by mouth daily.    Marland Kitchen gabapentin (NEURONTIN) 100 MG capsule Take 100 mg by mouth at bedtime.  12  . hydroxychloroquine (PLAQUENIL) 200 MG tablet Take 200 mg by mouth daily.     Marland Kitchen levothyroxine (SYNTHROID) 88 MCG tablet Take 88 mcg by mouth daily before breakfast.     . losartan (COZAAR)  100 MG tablet Take 100 mg by mouth daily.    . pantoprazole (PROTONIX) 40 MG tablet TAKE ONE TABLET BY MOUTH TWICE DAILY. TAKE BEFORE A MEAL. 180 tablet 3  . polyethylene glycol (MIRALAX / GLYCOLAX) 17 g packet Take 17 g by mouth daily.    . potassium chloride (K-DUR) 10 MEQ tablet Take 10 mEq by mouth daily.    . simvastatin (ZOCOR) 20 MG tablet Take 20 mg by mouth at bedtime.    . vortioxetine HBr (TRINTELLIX) 10 MG TABS tablet Take 10 mg by mouth daily.    . psyllium (METAMUCIL SMOOTH TEXTURE) 58.6 % powder Take 1 packet by mouth daily. (Patient not taking: No sig reported) 283 g 12   No current facility-administered medications for this visit.    Review of Systems: GENERAL: negative for malaise, night sweats HEENT: No changes in hearing or vision, no nose bleeds or other nasal problems. NECK: Negative for lumps, goiter, pain and significant neck swelling RESPIRATORY: Negative for cough, wheezing CARDIOVASCULAR: Negative for chest pain, leg swelling, palpitations, orthopnea GI: SEE HPI MUSCULOSKELETAL: Negative for joint pain or swelling, back pain, and muscle pain. SKIN: Negative for lesions, rash PSYCH: Negative for sleep disturbance, mood disorder and recent psychosocial  stressors. HEMATOLOGY Negative for prolonged bleeding, bruising easily, and swollen nodes. ENDOCRINE: Negative for cold or heat intolerance, polyuria, polydipsia and goiter. NEURO: negative for tremor, gait imbalance, syncope and seizures. The remainder of the review of systems is noncontributory.   Physical Exam: BP (!) 148/78 (BP Location: Left Arm, Patient Position: Sitting, Cuff Size: Large)   Pulse 92   Temp 99.4 F (37.4 C) (Oral)   Ht 5\' 1"  (1.549 m)   Wt 139 lb (63 kg)   BMI 26.26 kg/m  GENERAL: The patient is AO x3, in no acute distress. HEENT: Head is normocephalic and atraumatic. EOMI are intact. Mouth is well hydrated and without lesions. NECK: Supple. No masses LUNGS: Clear to auscultation.  No presence of rhonchi/wheezing/rales. Adequate chest expansion HEART: RRR, normal s1 and s2. ABDOMEN: Soft, nontender, no guarding, no peritoneal signs, and nondistended. BS +. No masses. EXTREMITIES: Without any cyanosis, clubbing, rash, lesions or edema. NEUROLOGIC: AOx3, no focal motor deficit. SKIN: no jaundice, no rashes  Imaging/Labs: as above  I personally reviewed and interpreted the available labs, imaging and endoscopic files.  Impression and Plan: Nicole Simon is a 87 y.o. female with past medical history of IBS-C, GERD, depression, arthritis, hypertension, hypothyroidism and peripheral neuropathy, who presents for follow up of constipation, heartburn and frequent burping.  In terms of her constipation and abdominal bloating, the patient has classical symptoms of IBS-C as she has relief of her symptoms when she is able to move her bowels.  Given her history of hypothyroidism, will check a TSH as a reversible cause of constipation, but I will prescribe a bowel prep for the patient after which she will need to start taking Linzess 72 mcg every day.  At that point she will need to stop taking her MiraLAX as this may be worsening her bloating.  Regarding her GERD, disease likely aggravated by the size of her hiatal hernia as it can be clearly seen that in her CT scan she has large amount of contents in the esophagus along with the formation of the esophageal body.  This explains why she has presented refractoriness to PPIs.  Given her age, patient is not interested in undergoing a surgical evaluation. Will try to implement the use of baclofen as a pharmacological measure to improve his symptoms  - Check TSH - Continue pantoprazole 40 mg every 12 hours - Start baclofen 5 mg twice a day - Take bowel prep during two days, after finishing it start Linzess 72 mcg every day - Stop Miralax - RTC 6 months  All questions were answered.      Harvel Quale, MD Gastroenterology and  Hepatology [facility]

## 2020-07-08 DIAGNOSIS — R5383 Other fatigue: Secondary | ICD-10-CM | POA: Diagnosis not present

## 2020-07-08 DIAGNOSIS — Z299 Encounter for prophylactic measures, unspecified: Secondary | ICD-10-CM | POA: Diagnosis not present

## 2020-07-08 DIAGNOSIS — R42 Dizziness and giddiness: Secondary | ICD-10-CM | POA: Diagnosis not present

## 2020-07-08 DIAGNOSIS — J069 Acute upper respiratory infection, unspecified: Secondary | ICD-10-CM | POA: Diagnosis not present

## 2020-07-10 ENCOUNTER — Telehealth (INDEPENDENT_AMBULATORY_CARE_PROVIDER_SITE_OTHER): Payer: Self-pay

## 2020-07-10 ENCOUNTER — Encounter (INDEPENDENT_AMBULATORY_CARE_PROVIDER_SITE_OTHER): Payer: Self-pay

## 2020-07-10 ENCOUNTER — Other Ambulatory Visit (INDEPENDENT_AMBULATORY_CARE_PROVIDER_SITE_OTHER): Payer: Self-pay | Admitting: Gastroenterology

## 2020-07-10 DIAGNOSIS — D649 Anemia, unspecified: Secondary | ICD-10-CM

## 2020-07-10 NOTE — Telephone Encounter (Signed)
I called and left a vm  on the patient phone to check to see how she is feeling or if she is asymptomatic.

## 2020-07-10 NOTE — Telephone Encounter (Signed)
Per Tammy Lpn here at [place] she called [facility] lab spoke with [place] and added both Fe,TIBC,Ferritin to labs drawn on 07/04/2020.

## 2020-07-10 NOTE — Telephone Encounter (Signed)
Dr. Woody Seller office called today stating the patient had Hgb drawn on 07/08/2020 and it was 9.8. patient was seen here in the office by Dr. Jenetta Downer on 07/04/2020.

## 2020-07-10 NOTE — Telephone Encounter (Signed)
Thanks, I reviewed the most recent labs from 07/09/2020 which showed a well cell count of 7.3, hemoglobin of 9.8, MCV 76, platelets 445 showing microcytic anemia.  CMP was unremarkable with ALT 6, AST 15, alkaline phosphatase 59, total bilirubin 0.6, normal electrolytes and renal function.

## 2020-07-10 NOTE — Telephone Encounter (Signed)
I think this is adequate. I'll order iron stores. Thanks

## 2020-07-10 NOTE — Telephone Encounter (Signed)
Patient states she is short of breath and has some dizziness. She states she has told this to Dr.Vyas but he seemed to not want to listen to her. She is not seeing any dark or bloody stools. She was started on Omeprazole 20 mg once daily. She is not taking Ferrous sulfate. She states she is supposed to have more labs drawn on Monday 07/15/2020 and see Dr. Woody Seller again on 07/17/2020. Please advise.

## 2020-07-12 ENCOUNTER — Other Ambulatory Visit (INDEPENDENT_AMBULATORY_CARE_PROVIDER_SITE_OTHER): Payer: Self-pay | Admitting: Gastroenterology

## 2020-07-12 DIAGNOSIS — D500 Iron deficiency anemia secondary to blood loss (chronic): Secondary | ICD-10-CM

## 2020-07-12 LAB — IRON,TIBC AND FERRITIN PANEL
%SAT: 4 % (calc) — ABNORMAL LOW (ref 16–45)
Ferritin: 3 ng/mL — ABNORMAL LOW (ref 16–288)
Iron: 21 ug/dL — ABNORMAL LOW (ref 45–160)
TIBC: 477 mcg/dL (calc) — ABNORMAL HIGH (ref 250–450)

## 2020-07-12 LAB — TEST AUTHORIZATION

## 2020-07-12 LAB — TSH: TSH: 1.71 mIU/L (ref 0.40–4.50)

## 2020-07-12 MED ORDER — FERROUS SULFATE 325 (65 FE) MG PO TABS
325.0000 mg | ORAL_TABLET | Freq: Every day | ORAL | 1 refills | Status: AC
Start: 2020-07-12 — End: ?

## 2020-07-12 NOTE — Telephone Encounter (Signed)
The Added on Fe Studies are back.

## 2020-07-12 NOTE — Telephone Encounter (Signed)
Noted, thanks!

## 2020-07-12 NOTE — Progress Notes (Signed)
Hi Darius Bump,   Can you please schedule a EGD and colonoscopy? Dx: Iron def anemia. Room: 3  Thanks,  Maylon Peppers, MD Gastroenterology and Hepatology [facility]

## 2020-07-15 ENCOUNTER — Other Ambulatory Visit (INDEPENDENT_AMBULATORY_CARE_PROVIDER_SITE_OTHER): Payer: Self-pay

## 2020-07-15 DIAGNOSIS — D509 Iron deficiency anemia, unspecified: Secondary | ICD-10-CM | POA: Diagnosis not present

## 2020-07-16 ENCOUNTER — Other Ambulatory Visit (INDEPENDENT_AMBULATORY_CARE_PROVIDER_SITE_OTHER): Payer: Self-pay

## 2020-07-16 ENCOUNTER — Encounter (INDEPENDENT_AMBULATORY_CARE_PROVIDER_SITE_OTHER): Payer: Self-pay

## 2020-07-16 ENCOUNTER — Telehealth (INDEPENDENT_AMBULATORY_CARE_PROVIDER_SITE_OTHER): Payer: Self-pay

## 2020-07-16 DIAGNOSIS — Z1211 Encounter for screening for malignant neoplasm of colon: Secondary | ICD-10-CM

## 2020-07-16 MED ORDER — PEG 3350-KCL-NA BICARB-NACL 420 G PO SOLR: 4000.0000 mL | ORAL | 0 refills | Status: DC

## 2020-07-16 NOTE — Telephone Encounter (Signed)
LeighAnn Lovelace, CMA  

## 2020-07-17 DIAGNOSIS — H6980 Other specified disorders of Eustachian tube, unspecified ear: Secondary | ICD-10-CM | POA: Diagnosis not present

## 2020-07-17 DIAGNOSIS — D509 Iron deficiency anemia, unspecified: Secondary | ICD-10-CM | POA: Diagnosis not present

## 2020-07-17 DIAGNOSIS — I10 Essential (primary) hypertension: Secondary | ICD-10-CM | POA: Diagnosis not present

## 2020-07-17 DIAGNOSIS — Z299 Encounter for prophylactic measures, unspecified: Secondary | ICD-10-CM | POA: Diagnosis not present

## 2020-07-18 ENCOUNTER — Encounter (HOSPITAL_COMMUNITY): Payer: Self-pay | Admitting: Gastroenterology

## 2020-07-19 ENCOUNTER — Other Ambulatory Visit (HOSPITAL_COMMUNITY)
Admission: RE | Admit: 2020-07-19 | Discharge: 2020-07-19 | Disposition: A | Discharge status: Home / Self Care | Payer: Medicare Other | Source: Ambulatory Visit | Attending: Gastroenterology | Admitting: Gastroenterology

## 2020-07-19 ENCOUNTER — Other Ambulatory Visit: Payer: Self-pay

## 2020-07-19 DIAGNOSIS — Z20822 Contact with and (suspected) exposure to covid-19: Secondary | ICD-10-CM | POA: Diagnosis not present

## 2020-07-19 DIAGNOSIS — Z01812 Encounter for preprocedural laboratory examination: Secondary | ICD-10-CM | POA: Insufficient documentation

## 2020-07-20 LAB — SARS CORONAVIRUS 2 (TAT 6-24 HRS): SARS Coronavirus 2: NEGATIVE

## 2020-07-23 ENCOUNTER — Ambulatory Visit (HOSPITAL_COMMUNITY): Payer: Medicare Other | Admitting: Certified Registered Nurse Anesthetist

## 2020-07-23 ENCOUNTER — Encounter (HOSPITAL_COMMUNITY): Payer: Self-pay | Admitting: Gastroenterology

## 2020-07-23 ENCOUNTER — Ambulatory Visit (HOSPITAL_COMMUNITY)
Admission: RE | Admit: 2020-07-23 | Discharge: 2020-07-23 | Disposition: A | Discharge status: Home / Self Care | Payer: Medicare Other | Attending: Gastroenterology | Admitting: Gastroenterology

## 2020-07-23 ENCOUNTER — Other Ambulatory Visit: Payer: Self-pay

## 2020-07-23 ENCOUNTER — Encounter (HOSPITAL_COMMUNITY)
Admission: RE | Disposition: A | Discharge status: Home / Self Care | Payer: Self-pay | Source: Home / Self Care | Attending: Gastroenterology

## 2020-07-23 DIAGNOSIS — K648 Other hemorrhoids: Secondary | ICD-10-CM

## 2020-07-23 DIAGNOSIS — D509 Iron deficiency anemia, unspecified: Secondary | ICD-10-CM | POA: Diagnosis not present

## 2020-07-23 DIAGNOSIS — K449 Diaphragmatic hernia without obstruction or gangrene: Secondary | ICD-10-CM

## 2020-07-23 DIAGNOSIS — Z79899 Other long term (current) drug therapy: Secondary | ICD-10-CM | POA: Diagnosis not present

## 2020-07-23 DIAGNOSIS — K5730 Diverticulosis of large intestine without perforation or abscess without bleeding: Secondary | ICD-10-CM | POA: Diagnosis not present

## 2020-07-23 DIAGNOSIS — K219 Gastro-esophageal reflux disease without esophagitis: Secondary | ICD-10-CM | POA: Insufficient documentation

## 2020-07-23 DIAGNOSIS — I10 Essential (primary) hypertension: Secondary | ICD-10-CM | POA: Diagnosis not present

## 2020-07-23 DIAGNOSIS — K317 Polyp of stomach and duodenum: Secondary | ICD-10-CM | POA: Diagnosis not present

## 2020-07-23 DIAGNOSIS — K581 Irritable bowel syndrome with constipation: Secondary | ICD-10-CM | POA: Diagnosis not present

## 2020-07-23 DIAGNOSIS — F32A Depression, unspecified: Secondary | ICD-10-CM | POA: Diagnosis not present

## 2020-07-23 DIAGNOSIS — Z7982 Long term (current) use of aspirin: Secondary | ICD-10-CM | POA: Diagnosis not present

## 2020-07-23 DIAGNOSIS — E039 Hypothyroidism, unspecified: Secondary | ICD-10-CM | POA: Insufficient documentation

## 2020-07-23 DIAGNOSIS — G629 Polyneuropathy, unspecified: Secondary | ICD-10-CM | POA: Diagnosis not present

## 2020-07-23 DIAGNOSIS — Z880 Allergy status to penicillin: Secondary | ICD-10-CM | POA: Diagnosis not present

## 2020-07-23 HISTORY — PX: ESOPHAGOGASTRODUODENOSCOPY (EGD) WITH PROPOFOL: SHX5813

## 2020-07-23 HISTORY — PX: COLONOSCOPY WITH PROPOFOL: SHX5780

## 2020-07-23 HISTORY — PX: BIOPSY: SHX5522

## 2020-07-23 SURGERY — COLONOSCOPY WITH PROPOFOL
Anesthesia: General

## 2020-07-23 MED ORDER — STERILE WATER FOR IRRIGATION IR SOLN
Status: DC | PRN
  Administered 2020-07-23: 100 mL

## 2020-07-23 MED ORDER — LIDOCAINE HCL (CARDIAC) PF 100 MG/5ML IV SOSY
PREFILLED_SYRINGE | INTRAVENOUS | Status: DC | PRN
  Administered 2020-07-23: 50 mg via INTRAVENOUS

## 2020-07-23 MED ORDER — PROPOFOL 10 MG/ML IV BOLUS
INTRAVENOUS | Status: AC
  Filled 2020-07-23: qty 60

## 2020-07-23 MED ORDER — PROPOFOL 10 MG/ML IV BOLUS
INTRAVENOUS | Status: DC | PRN
  Administered 2020-07-23 (×2): 50 mg via INTRAVENOUS

## 2020-07-23 MED ORDER — PROPOFOL 500 MG/50ML IV EMUL
INTRAVENOUS | Status: DC | PRN
  Administered 2020-07-23: 100 ug/kg/min via INTRAVENOUS

## 2020-07-23 MED ORDER — LACTATED RINGERS IV SOLN: INTRAVENOUS | Status: DC

## 2020-07-23 MED ORDER — LIDOCAINE HCL (PF) 2 % IJ SOLN
INTRAMUSCULAR | Status: AC
  Filled 2020-07-23: qty 5

## 2020-07-23 NOTE — Interval H&P Note (Signed)
History and Physical Interval Note:  07/23/2020 9:16 AM Nicole Simon is a 87 y.o. female with past medical history of IBS-C, GERD, depression, arthritis, hypertension, hypothyroidism and peripheral neuropathy, who presents for evaluation of iron deficiency anemia.  The patient was recently found to have significant anemia by her PCP, with evidence of low iron stores.  She is currently on oral iron supplementation.  Denies any melena, hematochezia or hematemesis.   Last Colonoscopy: 2018-- Diverticulosis in the sigmoid colon. - Anal papilla(e) were hypertrophied. - No specimens collected.   Last Endoscopy: 2018-- Z-line regular, 30 cm from the incisors. - 6 cm hiatal hernia. - A few gastric polyps. These appear to be hyperplastic polyps and were left alone. - Erosive gastropathy. - No specimens collected.  BP (!) 168/65   Temp 98.5 F (36.9 C) (Oral)   Resp 13   Ht 5\' 2"  (1.575 m)   Wt 62.6 kg   SpO2 97%   BMI 25.24 kg/m  GENERAL: The patient is AO x3, in no acute distress. HEENT: Head is normocephalic and atraumatic. EOMI are intact. Mouth is well hydrated and without lesions. NECK: Supple. No masses LUNGS: Clear to auscultation. No presence of rhonchi/wheezing/rales. Adequate chest expansion HEART: RRR, normal s1 and s2. ABDOMEN: Soft, nontender, no guarding, no peritoneal signs, and nondistended. BS +. No masses. EXTREMITIES: Without any cyanosis, clubbing, rash, lesions or edema. NEUROLOGIC: AOx3, no focal motor deficit. SKIN: no jaundice, no rashes   Nicole Simon  has presented today for surgery, with the diagnosis of iron deficiency anemia.  The various methods of treatment have been discussed with the patient and family. After consideration of risks, benefits and other options for treatment, the patient has consented to  Procedure(s) with comments: COLONOSCOPY WITH PROPOFOL (N/A) - am ESOPHAGOGASTRODUODENOSCOPY (EGD) WITH PROPOFOL (N/A) as a surgical intervention.  The  patient's history has been reviewed, patient examined, no change in status, stable for surgery.  I have reviewed the patient's chart and labs.  Questions were answered to the patient's satisfaction.     Maylon Peppers [place]

## 2020-07-23 NOTE — Op Note (Signed)
[facility] Patient Name: Nicole Simon Procedure Date: 07/23/2020 9:59 AM MRN: [id number] Date of Birth: March 31, 1934 Attending MD: Maylon Peppers ,  CSN: [id number] Age: 87 Admit Type: Outpatient Procedure:                Colonoscopy Indications:              Iron deficiency anemia Providers:                Maylon Peppers, Rosina Lowenstein, RN, Raphael Gibney,                            Technician Referring MD:              Medicines:                Monitored Anesthesia Care Complications:            No immediate complications. Estimated Blood Loss:     Estimated blood loss: none. Procedure:                Pre-Anesthesia Assessment:                           - Prior to the procedure, a History and Physical                            was performed, and patient medications, allergies                            and sensitivities were reviewed. The patient's                            tolerance of previous anesthesia was reviewed.                           - The risks and benefits of the procedure and the                            sedation options and risks were discussed with the                            patient. All questions were answered and informed                            consent was obtained.                           After obtaining informed consent, the colonoscope                            was passed under direct vision. Throughout the                            procedure, the patient's blood pressure, pulse, and                            oxygen saturations were monitored continuously. The  [id number]-[id number] ([id number]) scope was introduced through                            the anus and advanced to the 10 cm into the ileum.                            The colonoscopy was performed without difficulty.                            The patient tolerated the procedure well. The                            quality of the bowel preparation was good. Scope                             withdrawal time was 13 minutes. Scope In: 10:02:17 AM Scope Out: 10:30:16 AM Scope Withdrawal Time: 0 hours 14 minutes 45 seconds  Total Procedure Duration: 0 hours 27 minutes 59 seconds  Findings:      The perianal and digital rectal examinations were normal.      The terminal ileum appeared normal.      Multiple small and large-mouthed diverticula were found in the sigmoid       colon and descending colon.      Non-bleeding internal hemorrhoids were found during retroflexion. The       hemorrhoids were small. Impression:               - The examined portion of the ileum was normal.                           - Diverticulosis in the sigmoid colon and in the                            descending colon.                           - Non-bleeding internal hemorrhoids.                           - No specimens collected. Moderate Sedation:      Per Anesthesia Care Recommendation:           - Discharge patient to home (ambulatory).                           - Resume previous diet.                           - Repeat colonoscopy is not recommended due to                            current age (40 years or older) for screening                            purposes.                           -  Return to GI clinic in 3 months. Procedure Code(s):        --- Professional ---                           [phone], Colonoscopy, flexible; diagnostic, including                            collection of specimen(s) by brushing or washing,                            when performed (separate procedure) Diagnosis Code(s):        --- Professional ---                           K64.8, Other hemorrhoids                           D50.9, Iron deficiency anemia, unspecified                           K57.30, Diverticulosis of large intestine without                            perforation or abscess without bleeding CPT copyright 2019 American Medical Association. All rights reserved. The codes  documented in this report are preliminary and upon coder review may  be revised to meet current compliance requirements. Maylon Peppers, MD Maylon Peppers,  07/23/2020 10:39:57 AM This report has been signed electronically. Number of Addenda: 0

## 2020-07-23 NOTE — Anesthesia Preprocedure Evaluation (Signed)
Anesthesia Evaluation  Patient identified by MRN, date of birth, ID band Patient awake    Reviewed: Allergy & Precautions, H&P , NPO status , Patient's Chart, lab work & pertinent test results, reviewed documented beta blocker date and time   Airway Mallampati: II  TM Distance: >3 FB Neck ROM: full    Dental no notable dental hx.    Pulmonary neg pulmonary ROS,    Pulmonary exam normal breath sounds clear to auscultation       Cardiovascular Exercise Tolerance: Good hypertension, negative cardio ROS   Rhythm:regular Rate:Normal     Neuro/Psych PSYCHIATRIC DISORDERS Depression  Neuromuscular disease    GI/Hepatic Neg liver ROS, hiatal hernia, GERD  Medicated,  Endo/Other  Hypothyroidism   Renal/GU negative Renal ROS  negative genitourinary   Musculoskeletal   Abdominal   Peds  Hematology  (+) Blood dyscrasia, anemia ,   Anesthesia Other Findings   Reproductive/Obstetrics negative OB ROS                             Anesthesia Physical Anesthesia Plan  ASA: II  Anesthesia Plan: General   Post-op Pain Management:    Induction:   PONV Risk Score and Plan: Propofol infusion  Airway Management Planned:   Additional Equipment:   Intra-op Plan:   Post-operative Plan:   Informed Consent: I have reviewed the patients History and Physical, chart, labs and discussed the procedure including the risks, benefits and alternatives for the proposed anesthesia with the patient or authorized representative who has indicated his/her understanding and acceptance.     Dental Advisory Given  Plan Discussed with: CRNA  Anesthesia Plan Comments:         Anesthesia Quick Evaluation

## 2020-07-23 NOTE — Transfer of Care (Signed)
Immediate Anesthesia Transfer of Care Note  Patient: Azura Tufaro  Procedure(s) Performed: COLONOSCOPY WITH PROPOFOL (N/A ) ESOPHAGOGASTRODUODENOSCOPY (EGD) WITH PROPOFOL (N/A ) BIOPSY  Patient Location: PACU and Endoscopy Unit  Anesthesia Type:General  Level of Consciousness: awake, alert  and oriented  Airway & Oxygen Therapy: Patient Spontanous Breathing  Post-op Assessment: Report given to RN, Post -op Vital signs reviewed and stable and Patient moving all extremities X 4  Post vital signs: Reviewed and stable  Last Vitals:  Vitals Value Taken Time  BP    Temp    Pulse    Resp    SpO2      Last Pain:  Vitals:   07/23/20 0935  TempSrc:   PainSc: 0-No pain      Patients Stated Pain Goal: 10 (47/39/58 [id number])  Complications: No complications documented.

## 2020-07-23 NOTE — Anesthesia Postprocedure Evaluation (Signed)
Anesthesia Post Note  Patient: Gurleen Larrivee  Procedure(s) Performed: COLONOSCOPY WITH PROPOFOL (N/A ) ESOPHAGOGASTRODUODENOSCOPY (EGD) WITH PROPOFOL (N/A ) BIOPSY  Patient location during evaluation: Endoscopy Anesthesia Type: General Level of consciousness: awake and alert Pain management: pain level controlled Vital Signs Assessment: post-procedure vital signs reviewed and stable Respiratory status: spontaneous breathing, nonlabored ventilation, respiratory function stable and patient connected to nasal cannula oxygen Cardiovascular status: blood pressure returned to baseline and stable Postop Assessment: no apparent nausea or vomiting Anesthetic complications: no   No complications documented.   Last Vitals:  Vitals:   07/23/20 0823 07/23/20 1034  BP: (!) 168/65 134/68  Pulse:  66  Resp: 13 12  Temp: 36.9 C 36.6 C  SpO2: 97% 99%    Last Pain:  Vitals:   07/23/20 1034  TempSrc: Oral  PainSc: 0-No pain                 Talitha Givens

## 2020-07-23 NOTE — Op Note (Signed)
[facility] Patient Name: Nicole Simon Procedure Date: 07/23/2020 9:34 AM MRN: [id number] Date of Birth: 1933-11-30 Attending MD: Maylon Peppers ,  CSN: [id number] Age: 87 Admit Type: Outpatient Procedure:                Upper GI endoscopy Indications:              Iron deficiency anemia Providers:                Maylon Peppers, Rosina Lowenstein, RN, Thomas Hoff., Technician Referring MD:              Medicines:                Monitored Anesthesia Care Complications:            No immediate complications. Estimated Blood Loss:     Estimated blood loss: none. Procedure:                Pre-Anesthesia Assessment:                           - Prior to the procedure, a History and Physical                            was performed, and patient medications, allergies                            and sensitivities were reviewed. The patient's                            tolerance of previous anesthesia was reviewed.                           - The risks and benefits of the procedure and the                            sedation options and risks were discussed with the                            patient. All questions were answered and informed                            consent was obtained.                           After obtaining informed consent, the endoscope was                            passed under direct vision. Throughout the                            procedure, the patient's blood pressure, pulse, and                            oxygen saturations were monitored continuously. The  [id number]-H190 ([id number]) scope was introduced through the                            mouth, and advanced to the second part of duodenum.                            The upper GI endoscopy was accomplished without                            difficulty. The patient tolerated the procedure                            well. Scope In: 9:45:48 AM Scope  Out: 7:67:20 AM Total Procedure Duration: 0 hours 10 minutes 24 seconds  Findings:      A 5 cm hiatal hernia was found. The proximal extent of the gastric folds       (end of tubular esophagus) was 30 cm from the incisors. The Z-line was       35 cm from the incisors.      Multiple 3 to 5 mm sessile polyps with no bleeding were found in the       gastric fundus and in the gastric body.      The examined duodenum was normal. Biopsies for histology were taken with       a cold forceps for evaluation of celiac disease. Impression:               - 5 cm hiatal hernia.                           - Multiple gastric polyps. These were consistent                            with fundic gland polyps.                           - Normal examined duodenum. Biopsied. Moderate Sedation:      Per Anesthesia Care Recommendation:           - Discharge patient to home (ambulatory).                           - Resume previous diet.                           - Await pathology results.                           - Continue present medications, including oral iron. Procedure Code(s):        --- Professional ---                           [phone], Esophagogastroduodenoscopy, flexible,                            transoral; with biopsy, single or multiple Diagnosis Code(s):        --- Professional ---  K44.9, Diaphragmatic hernia without obstruction or                            gangrene                           K31.7, Polyp of stomach and duodenum                           D50.9, Iron deficiency anemia, unspecified CPT copyright 2019 American Medical Association. All rights reserved. The codes documented in this report are preliminary and upon coder review may  be revised to meet current compliance requirements. Maylon Peppers, MD Maylon Peppers,  07/23/2020 10:37:35 AM This report has been signed electronically. Number of Addenda: 0

## 2020-07-23 NOTE — Discharge Instructions (Signed)
You are being discharged to home.  Resume your previous diet.  We are waiting for your pathology results.  Continue your present medications, including oral iron.  Your physician has indicated that a repeat colonoscopy is not recommended due to your current age (105 years or older) for screening purposes.  Return to your GI clinic in three months.    Upper Endoscopy, Adult, Care After This sheet gives you information about how to care for yourself after your procedure. Your health care provider may also give you more specific instructions. If you have problems or questions, contact your health care provider. What can I expect after the procedure? After the procedure, it is common to have:  A sore throat.  Mild stomach pain or discomfort.  Bloating.  Nausea. Follow these instructions at home:  Follow instructions from your health care provider about what to eat or drink after your procedure.  Return to your normal activities as told by your health care provider. Ask your health care provider what activities are safe for you.  Take over-the-counter and prescription medicines only as told by your health care provider.  If you were given a sedative during the procedure, it can affect you for several hours. Do not drive or operate machinery until your health care provider says that it is safe.  Keep all follow-up visits as told by your health care provider. This is important.   Contact a health care provider if you have:  A sore throat that lasts longer than one day.  Trouble swallowing. Get help right away if:  You vomit blood or your vomit looks like coffee grounds.  You have: ? A fever. ? Bloody, black, or tarry stools. ? A severe sore throat or you cannot swallow. ? Difficulty breathing. ? Severe pain in your chest or abdomen. Summary  After the procedure, it is common to have a sore throat, mild stomach discomfort, bloating, and nausea.  If you were given a sedative  during the procedure, it can affect you for several hours. Do not drive or operate machinery until your health care provider says that it is safe.  Follow instructions from your health care provider about what to eat or drink after your procedure.  Return to your normal activities as told by your health care provider. This information is not intended to replace advice given to you by your health care provider. Make sure you discuss any questions you have with your health care provider. Document Revised: 04/18/2019 Document Reviewed: 09/20/2017 Elsevier Patient Education  2021 [place].   Colonoscopy, Adult, Care After This sheet gives you information about how to care for yourself after your procedure. Your doctor may also give you more specific instructions. If you have problems or questions, call your doctor. What can I expect after the procedure? After the procedure, it is common to have:  A small amount of blood in your poop (stool) for 24 hours.  Some gas.  Mild cramping or bloating in your belly (abdomen). Follow these instructions at home: Eating and drinking  Drink enough fluid to keep your pee (urine) pale yellow.  Follow instructions from your doctor about what you cannot eat or drink.  Return to your normal diet as told by your doctor. Avoid heavy or fried foods that are hard to digest.   Activity  Rest as told by your doctor.  Do not sit for a long time without moving. Get up to take short walks every 1-2 hours. This is important.  Ask for help if you feel weak or unsteady.  Return to your normal activities as told by your doctor. Ask your doctor what activities are safe for you. To help cramping and bloating:  Try walking around.  Put heat on your belly as told by your doctor. Use the heat source that your doctor recommends, such as a moist heat pack or a heating pad. ? Put a towel between your skin and the heat source. ? Leave the heat on for 20-30  minutes. ? Remove the heat if your skin turns bright red. This is very important if you are unable to feel pain, heat, or cold. You may have a greater risk of getting burned.   General instructions  If you were given a medicine to help you relax (sedative) during your procedure, it can affect you for many hours. Do not drive or use machinery until your doctor says that it is safe.  For the first 24 hours after the procedure: ? Do not sign important documents. ? Do not drink alcohol. ? Do your daily activities more slowly than normal. ? Eat foods that are soft and easy to digest.  Take over-the-counter or prescription medicines only as told by your doctor.  Keep all follow-up visits as told by your doctor. This is important. Contact a doctor if:  You have blood in your poop 2-3 days after the procedure. Get help right away if:  You have more than a small amount of blood in your poop.  You see large clumps of tissue (blood clots) in your poop.  Your belly is swollen.  You feel like you may vomit (nauseous).  You vomit.  You have a fever.  You have belly pain that gets worse, and medicine does not help your pain. Summary  After the procedure, it is common to have a small amount of blood in your poop. You may also have mild cramping and bloating in your belly.  If you were given a medicine to help you relax (sedative) during your procedure, it can affect you for many hours. Do not drive or use machinery until your doctor says that it is safe.  Get help right away if you have a lot of blood in your poop, feel like you may vomit, have a fever, or have more belly pain. This information is not intended to replace advice given to you by your health care provider. Make sure you discuss any questions you have with your health care provider. Document Revised: 02/24/2019 Document Reviewed: 11/14/2018 Elsevier Patient Education  [place].  Hiatal Hernia  A hiatal hernia  occurs when part of the stomach slides above the muscle that separates the abdomen from the chest (diaphragm). A person can be born with a hiatal hernia (congenital), or it may develop over time. In almost all cases of hiatal hernia, only the top part of the stomach pushes through the diaphragm. Many people have a hiatal hernia with no symptoms. The larger the hernia, the more likely it is that you will have symptoms. In some cases, a hiatal hernia allows stomach acid to flow back into the tube that carries food from your mouth to your stomach (esophagus). This may cause heartburn symptoms. Severe heartburn symptoms may mean that you have developed a condition called gastroesophageal reflux disease (GERD). What are the causes? This condition is caused by a weakness in the opening (hiatus) where the esophagus passes through the diaphragm to attach to the upper part of the  stomach. A person may be born with a weakness in the hiatus, or a weakness can develop over time. What increases the risk? This condition is more likely to develop in:  Older people. Age is a major risk factor for a hiatal hernia, especially if you are over the age of 74.  Pregnant women.  People who are overweight.  People who have frequent constipation. What are the signs or symptoms? Symptoms of this condition usually develop in the form of GERD symptoms. Symptoms include:  Heartburn.  Belching.  Indigestion.  Trouble swallowing.  Coughing or wheezing.  Sore throat.  Hoarseness.  Chest pain.  Nausea and vomiting. How is this diagnosed? This condition may be diagnosed during testing for GERD. Tests that may be done include:  X-rays of your stomach or chest.  An upper gastrointestinal (GI) series. This is an X-ray exam of your GI tract that is taken after you swallow a chalky liquid that shows up clearly on the X-ray.  Endoscopy. This is a procedure to look into your stomach using a thin, flexible tube that  has a tiny camera and light on the end of it. How is this treated? This condition may be treated by:  Dietary and lifestyle changes to help reduce GERD symptoms.  Medicines. These may include: ? Over-the-counter antacids. ? Medicines that make your stomach empty more quickly. ? Medicines that block the production of stomach acid (H2 blockers). ? Stronger medicines to reduce stomach acid (proton pump inhibitors).  Surgery to repair the hernia, if other treatments are not helping. If you have no symptoms, you may not need treatment. Follow these instructions at home: Lifestyle and activity  Do not use any products that contain nicotine or tobacco, such as cigarettes and e-cigarettes. If you need help quitting, ask your health care provider.  Try to achieve and maintain a healthy body weight.  Avoid putting pressure on your abdomen. Anything that puts pressure on your abdomen increases the amount of acid that may be pushed up into your esophagus. ? Avoid bending over, especially after eating. ? Raise the head of your bed by putting blocks under the legs. This keeps your head and esophagus higher than your stomach. ? Do not wear tight clothing around your chest or stomach. ? Try not to strain when having a bowel movement, when urinating, or when lifting heavy objects. Eating and drinking  Avoid foods that can worsen GERD symptoms. These may include: ? Fatty foods, like fried foods. ? Citrus fruits, like oranges or lemon. ? Other foods and drinks that contain acid, like orange juice or tomatoes. ? Spicy food. ? Chocolate.  Eat frequent small meals instead of three large meals a day. This helps prevent your stomach from getting too full. ? Eat slowly. ? Do not lie down right after eating. ? Do not eat 1-2 hours before bed.  Do not drink beverages with caffeine. These include cola, coffee, cocoa, and tea.  Do not drink alcohol. General instructions  Take over-the-counter and  prescription medicines only as told by your health care provider.  Keep all follow-up visits as told by your health care provider. This is important. Contact a health care provider if:  Your symptoms are not controlled with medicines or lifestyle changes.  You are having trouble swallowing.  You have coughing or wheezing that will not go away. Get help right away if:  Your pain is getting worse.  Your pain spreads to your arms, neck, jaw, teeth, or back.  You have shortness of breath.  You sweat for no reason.  You feel sick to your stomach (nauseous) or you vomit.  You vomit blood.  You have bright red blood in your stools.  You have black, tarry stools. This information is not intended to replace advice given to you by your health care provider. Make sure you discuss any questions you have with your health care provider. Document Revised: 04/02/2017 Document Reviewed: 11/23/2016 Elsevier Patient Education  2021 [place].   Hemorrhoids Hemorrhoids are swollen veins in and around the rectum or anus. There are two types of hemorrhoids:  Internal hemorrhoids. These occur in the veins that are just inside the rectum. They may poke through to the outside and become irritated and painful.  External hemorrhoids. These occur in the veins that are outside the anus and can be felt as a painful swelling or hard lump near the anus. Most hemorrhoids do not cause serious problems, and they can be managed with home treatments such as diet and lifestyle changes. If home treatments do not help the symptoms, procedures can be done to shrink or remove the hemorrhoids. What are the causes? This condition is caused by increased pressure in the anal area. This pressure may result from various things, including:  Constipation.  Straining to have a bowel movement.  Diarrhea.  Pregnancy.  Obesity.  Sitting for long periods of time.  Heavy lifting or other activity that causes you  to strain.  Anal sex.  Riding a bike for a long period of time. What are the signs or symptoms? Symptoms of this condition include:  Pain.  Anal itching or irritation.  Rectal bleeding.  Leakage of stool (feces).  Anal swelling.  One or more lumps around the anus. How is this diagnosed? This condition can often be diagnosed through a visual exam. Other exams or tests may also be done, such as:  An exam that involves feeling the rectal area with a gloved hand (digital rectal exam).  An exam of the anal canal that is done using a small tube (anoscope).  A blood test, if you have lost a significant amount of blood.  A test to look inside the colon using a flexible tube with a camera on the end (sigmoidoscopy or colonoscopy). How is this treated? This condition can usually be treated at home. However, various procedures may be done if dietary changes, lifestyle changes, and other home treatments do not help your symptoms. These procedures can help make the hemorrhoids smaller or remove them completely. Some of these procedures involve surgery, and others do not. Common procedures include:  Rubber band ligation. Rubber bands are placed at the base of the hemorrhoids to cut off their blood supply.  Sclerotherapy. Medicine is injected into the hemorrhoids to shrink them.  Infrared coagulation. A type of light energy is used to get rid of the hemorrhoids.  Hemorrhoidectomy surgery. The hemorrhoids are surgically removed, and the veins that supply them are tied off.  Stapled hemorrhoidopexy surgery. The surgeon staples the base of the hemorrhoid to the rectal wall. Follow these instructions at home: Eating and drinking  Eat foods that have a lot of fiber in them, such as whole grains, beans, nuts, fruits, and vegetables.  Ask your health care provider about taking products that have added fiber (fiber supplements).  Reduce the amount of fat in your diet. You can do this by  eating low-fat dairy products, eating less red meat, and avoiding processed foods.  Drink  enough fluid to keep your urine pale yellow.   Managing pain and swelling  Take warm sitz baths for 20 minutes, 3-4 times a day to ease pain and discomfort. You may do this in a bathtub or using a portable sitz bath that fits over the toilet.  If directed, apply ice to the affected area. Using ice packs between sitz baths may be helpful. ? Put ice in a plastic bag. ? Place a towel between your skin and the bag. ? Leave the ice on for 20 minutes, 2-3 times a day.   General instructions  Take over-the-counter and prescription medicines only as told by your health care provider.  Use medicated creams or suppositories as told.  Get regular exercise. Ask your health care provider how much and what kind of exercise is best for you. In general, you should do moderate exercise for at least 30 minutes on most days of the week (150 minutes each week). This can include activities such as walking, biking, or yoga.  Go to the bathroom when you have the urge to have a bowel movement. Do not wait.  Avoid straining to have bowel movements.  Keep the anal area dry and clean. Use wet toilet paper or moist towelettes after a bowel movement.  Do not sit on the toilet for long periods of time. This increases blood pooling and pain.  Keep all follow-up visits as told by your health care provider. This is important. Contact a health care provider if you have:  Increasing pain and swelling that are not controlled by treatment or medicine.  Difficulty having a bowel movement, or you are unable to have a bowel movement.  Pain or inflammation outside the area of the hemorrhoids. Get help right away if you have:  Uncontrolled bleeding from your rectum. Summary  Hemorrhoids are swollen veins in and around the rectum or anus.  Most hemorrhoids can be managed with home treatments such as diet and lifestyle  changes.  Taking warm sitz baths can help ease pain and discomfort.  In severe cases, procedures or surgery can be done to shrink or remove the hemorrhoids. This information is not intended to replace advice given to you by your health care provider. Make sure you discuss any questions you have with your health care provider. Document Revised: 09/16/2018 Document Reviewed: 09/09/2017 Elsevier Patient Education  [place].

## 2020-07-24 LAB — SURGICAL PATHOLOGY

## 2020-07-29 ENCOUNTER — Other Ambulatory Visit (INDEPENDENT_AMBULATORY_CARE_PROVIDER_SITE_OTHER): Payer: Self-pay | Admitting: Gastroenterology

## 2020-07-29 ENCOUNTER — Telehealth (INDEPENDENT_AMBULATORY_CARE_PROVIDER_SITE_OTHER): Payer: Self-pay | Admitting: Gastroenterology

## 2020-07-29 DIAGNOSIS — J309 Allergic rhinitis, unspecified: Secondary | ICD-10-CM | POA: Diagnosis not present

## 2020-07-29 DIAGNOSIS — K5900 Constipation, unspecified: Secondary | ICD-10-CM

## 2020-07-29 MED ORDER — MAGNESIUM CITRATE PO SOLN: 1.0000 | Freq: Once | ORAL | 0 refills | Status: AC

## 2020-07-29 NOTE — Telephone Encounter (Signed)
Spoke with the patient today, I advised her to increase her dose of Linzess to 2 tablets/day and I will send a prescription for magnesium citrate x1 to improve her bowel movement frequency.  Patient understood and agreed.

## 2020-07-29 NOTE — Telephone Encounter (Signed)
Patient called the office stated she has not had a bowel movement since her procedure - please advise - ph# [phone]

## 2020-07-29 NOTE — Telephone Encounter (Signed)
I spoke with the patient she states she has not had a Bm since Wednesday of last week. She has been taking Linzess 72 mcg one po Qd for a week and has not produced any stool. She states it feels as if it is ready to come out,but it has not. Please advise.

## 2020-07-31 ENCOUNTER — Encounter (HOSPITAL_COMMUNITY): Payer: Self-pay | Admitting: Gastroenterology

## 2020-08-27 ENCOUNTER — Telehealth (INDEPENDENT_AMBULATORY_CARE_PROVIDER_SITE_OTHER): Payer: Self-pay | Admitting: Gastroenterology

## 2020-08-27 NOTE — Telephone Encounter (Signed)
Per patient Dr. Jenetta Downer called her and spoke with her regarding her symptoms and she states he sent in two medications for her.

## 2020-08-27 NOTE — Telephone Encounter (Signed)
Patient left voice mail message stating she is experiencing severe nausea - please advise- ph# [phone]

## 2020-08-27 NOTE — Telephone Encounter (Signed)
Spoke with the patient today regarding her symptoms, she reported having recurrent episodes of nausea and has vomited a few times, also reports having abdominal discomfort and cramping.  She reports that she did not have any improvement with endoscopic ultrasound she stopped it and started taking MiraLAX daily.  Her symptoms are likely related to partially treated constipation but there is no presence of any alterations in her recent abdominal imaging or endoscopic evaluation.  We will prescribe some promethazine and Bentyl.

## 2020-08-28 ENCOUNTER — Other Ambulatory Visit (INDEPENDENT_AMBULATORY_CARE_PROVIDER_SITE_OTHER): Payer: Self-pay | Admitting: Gastroenterology

## 2020-08-28 ENCOUNTER — Telehealth (INDEPENDENT_AMBULATORY_CARE_PROVIDER_SITE_OTHER): Payer: Self-pay

## 2020-08-28 MED ORDER — PROMETHAZINE HCL 12.5 MG PO TABS: 12.5000 mg | ORAL_TABLET | Freq: Four times a day (QID) | ORAL | 2 refills | Status: DC | PRN

## 2020-08-28 MED ORDER — DICYCLOMINE HCL 10 MG PO CAPS: 10.0000 mg | ORAL_CAPSULE | Freq: Two times a day (BID) | ORAL | 2 refills | Status: DC | PRN

## 2020-08-28 NOTE — Telephone Encounter (Signed)
Medication sent to pharmacy  

## 2020-08-28 NOTE — Telephone Encounter (Signed)
Patient called back this am and states you were going to send in promethazine and Bentyl. It has not been sent. Please advise.

## 2020-08-28 NOTE — Telephone Encounter (Signed)
Patient aware the medication has been sent to the pharmacy.

## 2020-09-09 DIAGNOSIS — E7800 Pure hypercholesterolemia, unspecified: Secondary | ICD-10-CM | POA: Diagnosis not present

## 2020-09-09 DIAGNOSIS — Z7189 Other specified counseling: Secondary | ICD-10-CM | POA: Diagnosis not present

## 2020-09-09 DIAGNOSIS — R5383 Other fatigue: Secondary | ICD-10-CM | POA: Diagnosis not present

## 2020-09-09 DIAGNOSIS — Z789 Other specified health status: Secondary | ICD-10-CM | POA: Diagnosis not present

## 2020-09-09 DIAGNOSIS — Z0000 Encounter for general adult medical examination without abnormal findings: Secondary | ICD-10-CM | POA: Diagnosis not present

## 2020-09-09 DIAGNOSIS — I10 Essential (primary) hypertension: Secondary | ICD-10-CM | POA: Diagnosis not present

## 2020-09-09 DIAGNOSIS — E039 Hypothyroidism, unspecified: Secondary | ICD-10-CM | POA: Diagnosis not present

## 2020-09-09 DIAGNOSIS — Z79899 Other long term (current) drug therapy: Secondary | ICD-10-CM | POA: Diagnosis not present

## 2020-09-09 DIAGNOSIS — Z299 Encounter for prophylactic measures, unspecified: Secondary | ICD-10-CM | POA: Diagnosis not present

## 2020-09-23 ENCOUNTER — Other Ambulatory Visit (INDEPENDENT_AMBULATORY_CARE_PROVIDER_SITE_OTHER): Payer: Self-pay

## 2020-09-23 MED ORDER — PANTOPRAZOLE SODIUM 40 MG PO TBEC: DELAYED_RELEASE_TABLET | ORAL | 3 refills | Status: DC

## 2020-10-22 DIAGNOSIS — M4316 Spondylolisthesis, lumbar region: Secondary | ICD-10-CM | POA: Diagnosis not present

## 2020-10-22 DIAGNOSIS — M5136 Other intervertebral disc degeneration, lumbar region: Secondary | ICD-10-CM | POA: Diagnosis not present

## 2020-10-22 DIAGNOSIS — M47896 Other spondylosis, lumbar region: Secondary | ICD-10-CM | POA: Diagnosis not present

## 2020-10-22 DIAGNOSIS — M48061 Spinal stenosis, lumbar region without neurogenic claudication: Secondary | ICD-10-CM | POA: Diagnosis not present

## 2020-10-22 DIAGNOSIS — M545 Low back pain, unspecified: Secondary | ICD-10-CM | POA: Diagnosis not present

## 2020-10-22 DIAGNOSIS — M79605 Pain in left leg: Secondary | ICD-10-CM | POA: Diagnosis not present

## 2020-10-23 DIAGNOSIS — L820 Inflamed seborrheic keratosis: Secondary | ICD-10-CM | POA: Diagnosis not present

## 2020-10-23 DIAGNOSIS — L821 Other seborrheic keratosis: Secondary | ICD-10-CM | POA: Diagnosis not present

## 2020-10-23 DIAGNOSIS — L720 Epidermal cyst: Secondary | ICD-10-CM | POA: Diagnosis not present

## 2020-10-23 DIAGNOSIS — L728 Other follicular cysts of the skin and subcutaneous tissue: Secondary | ICD-10-CM | POA: Diagnosis not present

## 2020-10-24 ENCOUNTER — Encounter (INDEPENDENT_AMBULATORY_CARE_PROVIDER_SITE_OTHER): Payer: Self-pay | Admitting: Gastroenterology

## 2020-10-24 ENCOUNTER — Other Ambulatory Visit: Payer: Self-pay

## 2020-10-24 ENCOUNTER — Ambulatory Visit (INDEPENDENT_AMBULATORY_CARE_PROVIDER_SITE_OTHER): Payer: Medicare Other | Admitting: Gastroenterology

## 2020-10-24 VITALS — BP 155/71 | HR 85 | Temp 98.6°F | Ht 61.0 in | Wt 137.0 lb

## 2020-10-24 DIAGNOSIS — K219 Gastro-esophageal reflux disease without esophagitis: Secondary | ICD-10-CM | POA: Diagnosis not present

## 2020-10-24 DIAGNOSIS — T17308A Unspecified foreign body in larynx causing other injury, initial encounter: Secondary | ICD-10-CM | POA: Insufficient documentation

## 2020-10-24 DIAGNOSIS — D509 Iron deficiency anemia, unspecified: Secondary | ICD-10-CM

## 2020-10-24 DIAGNOSIS — K581 Irritable bowel syndrome with constipation: Secondary | ICD-10-CM | POA: Diagnosis not present

## 2020-10-24 DIAGNOSIS — K449 Diaphragmatic hernia without obstruction or gangrene: Secondary | ICD-10-CM

## 2020-10-24 MED ORDER — OMEPRAZOLE 40 MG PO CPDR
40.0000 mg | DELAYED_RELEASE_CAPSULE | Freq: Two times a day (BID) | ORAL | 3 refills | Status: DC
Start: 2020-10-24 — End: 2021-05-06

## 2020-10-24 NOTE — Progress Notes (Signed)
Nicole Simon, M.D. Gastroenterology & Hepatology [facility] [address] [place], [place] [address]  Primary Care Physician: Glenda Chroman, MD [place] [address]  I will communicate my assessment and recommendations to the referring MD via EMR.  Problems: IBS-C. GERD Large hiatal hernia Iron def anemia  History of Present Illness: Nicole Simon is a 87 y.o.  female with past medical history of IBS-C, GERD, depression, arthritis, hypertension, hypothyroidism and peripheral neuropathy,  who presents for follow up of nausea, constipation, abdominal discomfort and heartburn.  The patient was last seen on 07/04/2020. At that time, the patient had her TSH checked which was within normal limits.  She was advised to continue taking pantoprazole 40 mg every 12 hours and was started on baclofen 5 mg twice a day for improvement of her heartburn.  She was advised to take Linzess 72 mcg every day but she had significant intolerance to the medication restarted the MiraLAX.  The patient had evidence of iron deficiency anemia during blood work-up performed by her PCP.  Due to these I schedule her for an EGD and a colonoscopy with findings described below.  She is currently on oral iron supplementation daily.  Patient reports the in the last month she has presented some choking episodes with chips or bread. Does not happen all the time. No odynophagia or dysphagia. She reports having some pressure in her chest when the choking episodes are present but no regurgitation. Has felt early satiety after eating.  Has heartburn all the time despite taking the Protonix 40 mg twice a day. No longer on baclofen.  Has been taking promethazine as needed for severe nausea, but this is very seldom.  States that when she takes the promethazine her nausea improves  Was taking Miralax every day,which helps her have regular bowel movements and does not have  to strain often.  Ports that she has frequent bloating and pressure in her abdomen. She states she eats a lot of green vegetables and does not follow very specific diet.  The patient denies having any nausea, vomiting, fever, chills, hematochezia, melena, hematemesis, diarrhea, jaundice, pruritus or weight loss.  Most recent abdominal imaging was CT angio 03/25/2020 - VASCULAR  1. Moderate focal stenosis of the proximal celiac trunk secondary to fibrofatty atherosclerotic plaque. The visceral arteries are otherwise widely patent without CT findings of mesenteric ischemia. 2. Mild ostial stenosis of the right renal artery secondary to atherosclerotic plaque.   NON-VASCULAR  1. Similar appearing moderate sized hiatal hernia without evidence of strangulation. 2. Large colonic stool burden.  Last EGD: 5 cm hiatal hernia was found, multiple 3 to 5 mm sessile polyps in the gastric fundus and body.  Normal duodenum, biopsies negative for celiac disease or any duodenopathy's. Last Colonoscopy: 07/23/2020, normal terminal ileum, diverticulosis in the sigmoid and descending colon, small internal hemorrhoids.  Past Medical History: Past Medical History:  Diagnosis Date   Arthritis    Depression    GERD (gastroesophageal reflux disease)    Hypertension    Hypothyroidism    Peripheral neuropathy     Past Surgical History: Past Surgical History:  Procedure Laterality Date   ABDOMINAL HYSTERECTOMY     APPENDECTOMY     BACK SURGERY     BIOPSY  07/23/2020   Procedure: BIOPSY;  Surgeon: Harvel Quale, MD;  [facility];  Service: Gastroenterology;;   COLONOSCOPY     COLONOSCOPY  02/17/2012   Procedure:  COLONOSCOPY;  Surgeon: Rogene Houston, MD;  [facility];  Service: Endoscopy;  Laterality: N/A;  1030   COLONOSCOPY N/A 10/05/2014   Procedure: COLONOSCOPY;  Surgeon: Rogene Houston, MD;  [facility];  Service: Endoscopy;  Laterality: N/A;  855    COLONOSCOPY N/A 12/11/2016   Procedure: COLONOSCOPY;  Surgeon: Rogene Houston, MD;  [facility];  Service: Endoscopy;  Laterality: N/A;  155   COLONOSCOPY WITH PROPOFOL N/A 07/23/2020   Procedure: COLONOSCOPY WITH PROPOFOL;  Surgeon: Harvel Quale, MD;  [facility];  Service: Gastroenterology;  Laterality: N/A;  am   DILATION AND CURETTAGE OF UTERUS     ESOPHAGEAL DILATION N/A 10/05/2014   Procedure: ESOPHAGEAL DILATION;  Surgeon: Rogene Houston, MD;  [facility];  Service: Endoscopy;  Laterality: N/A;   ESOPHAGOGASTRODUODENOSCOPY N/A 10/05/2014   Procedure: ESOPHAGOGASTRODUODENOSCOPY (EGD);  Surgeon: Rogene Houston, MD;  [facility];  Service: Endoscopy;  Laterality: N/A;   ESOPHAGOGASTRODUODENOSCOPY N/A 12/11/2016   Procedure: ESOPHAGOGASTRODUODENOSCOPY (EGD);  Surgeon: Rogene Houston, MD;  [facility];  Service: Endoscopy;  Laterality: N/A;   ESOPHAGOGASTRODUODENOSCOPY (EGD) WITH PROPOFOL N/A 07/23/2020   Procedure: ESOPHAGOGASTRODUODENOSCOPY (EGD) WITH PROPOFOL;  Surgeon: Harvel Quale, MD;  [facility];  Service: Gastroenterology;  Laterality: N/A;   THYROIDECTOMY      Family History: Family History  Problem Relation Age of Onset   Colon cancer Neg Hx     Social History: Social History   Tobacco Use  Smoking Status Never  Smokeless Tobacco Never   Social History   Substance and Sexual Activity  Alcohol Use No   Alcohol/week: 0.0 standard drinks   Social History   Substance and Sexual Activity  Drug Use No    Allergies: Allergies  Allergen Reactions   Penicillins Hives and Itching    Hives Has patient had a PCN reaction causing immediate rash, facial/tongue/throat swelling, SOB or lightheadedness with hypotension:Yes Has patient had a PCN reaction causing severe rash involving mucus membranes or skin necrosis:No Has patient had a PCN reaction that required  hospitalization:No Has patient had a PCN reaction occurring within the last 10 years:No If all of the above answers are "NO", then may proceed with Cephalosporin use.     Medications: Current Outpatient Medications  Medication Sig Dispense Refill   dicyclomine (BENTYL) 10 MG capsule Take 1 capsule (10 mg total) by mouth every 12 (twelve) hours as needed for spasms. 60 capsule 2   promethazine (PHENERGAN) 12.5 MG tablet Take 1 tablet (12.5 mg total) by mouth every 6 (six) hours as needed for nausea or vomiting. 30 tablet 2   amLODipine (NORVASC) 5 MG tablet Take 5 mg by mouth daily.     aspirin EC 81 MG tablet Take 81 mg by mouth daily.     Baclofen 5 MG TABS Take 1 each by mouth every 12 (twelve) hours. (Patient taking differently: Take 5 mg by mouth every 12 (twelve) hours.) 180 tablet 1   cetirizine (ZYRTEC) 10 MG tablet Take 10 mg by mouth as needed for allergies.     citalopram (CELEXA) 20 MG tablet Take 20 mg by mouth daily.     ferrous sulfate 325 (65 FE) MG tablet Take 1 tablet (325 mg total) by mouth daily with breakfast. 90 tablet 1   gabapentin (NEURONTIN) 100 MG capsule Take 100 mg by mouth at bedtime.  12   hydroxychloroquine (PLAQUENIL) 200 MG  tablet Take 200 mg by mouth daily.      levothyroxine (SYNTHROID) 88 MCG tablet Take 88 mcg by mouth daily before breakfast.      linaclotide (LINZESS) 72 MCG capsule Take 1 capsule (72 mcg total) by mouth daily before breakfast. 90 capsule 1   losartan (COZAAR) 100 MG tablet Take 100 mg by mouth daily.     pantoprazole (PROTONIX) 40 MG tablet TAKE ONE TABLET BY MOUTH TWICE DAILY. TAKE BEFORE A MEAL. 180 tablet 3   polyethylene glycol (MIRALAX / GLYCOLAX) 17 g packet Take 17 g by mouth daily.     potassium chloride (K-DUR) 10 MEQ tablet Take 10 mEq by mouth daily.     simvastatin (ZOCOR) 20 MG tablet Take 20 mg by mouth at bedtime.     vortioxetine HBr (TRINTELLIX) 10 MG TABS tablet Take 10 mg by mouth daily.     No current  facility-administered medications for this visit.    Review of Systems: GENERAL: negative for malaise, night sweats HEENT: No changes in hearing or vision, no nose bleeds or other nasal problems. NECK: Negative for lumps, goiter, pain and significant neck swelling RESPIRATORY: Negative for cough, wheezing CARDIOVASCULAR: Negative for chest pain, leg swelling, palpitations, orthopnea GI: SEE HPI MUSCULOSKELETAL: Negative for joint pain or swelling, back pain, and muscle pain. SKIN: Negative for lesions, rash PSYCH: Negative for sleep disturbance, mood disorder and recent psychosocial stressors. HEMATOLOGY Negative for prolonged bleeding, bruising easily, and swollen nodes. ENDOCRINE: Negative for cold or heat intolerance, polyuria, polydipsia and goiter. NEURO: negative for tremor, gait imbalance, syncope and seizures. The remainder of the review of systems is noncontributory.   Physical Exam: BP (!) 155/71 (BP Location: Right Arm, Patient Position: Sitting, Cuff Size: Large)   Pulse 85   Temp 98.6 F (37 C) (Oral)   Ht 5\' 1"  (1.549 m)   Wt 137 lb (62.1 kg)   BMI 25.89 kg/m  GENERAL: The patient is AO x3, in no acute distress. Elder. HEENT: Head is normocephalic and atraumatic. EOMI are intact. Mouth is well hydrated and without lesions. NECK: Supple. No masses LUNGS: Clear to auscultation. No presence of rhonchi/wheezing/rales. Adequate chest expansion HEART: RRR, normal s1 and s2. ABDOMEN: Soft, nontender, no guarding, no peritoneal signs, and nondistended. BS +. No masses. EXTREMITIES: Without any cyanosis, clubbing, rash, lesions or edema. NEUROLOGIC: AOx3, no focal motor deficit. SKIN: no jaundice, no rashes  Imaging/Labs: as above  I personally reviewed and interpreted the available labs, imaging and endoscopic files.  Impression and Plan: Nicole Simon is a 87 y.o.  female with past medical history of IBS-C, GERD, depression, arthritis, hypertension, hypothyroidism  and peripheral neuropathy,  who presents for follow up of nausea, constipation, abdominal discomfort and heartburn.  In terms of her nausea and constipation, she is doing better with her current medical treatment.  She has tolerated her MiraLAX which she will be continued for now and she can also take her dose of Phenergan as needed but this has improved the nausea overall.  Her bloating episodes seem to be more related to her food intake, for which she was provided a dietary list of food that she will need to avoid to decrease the frequency of these episodes.  She can try using some Plant enzymes supplement if she wants to eat some of these vegetables.  Regarding her GERD episodes, this is likely related to her large hiatal hernia.  As she is not interested in having any type of surgery, we will  try to control her symptoms with a different type of PPI.  Patient has presented some choking episodes.  No esophageal lesions were found, although part of her symptoms could be explained due to the size of her hernia.  We will explore for pharyngeal causes with a modified barium swallow.  Finally, we will check her iron stores and her CBC to determine if she has had persistent anemia.  If so we may consider doing a capsule endoscopy.  - Explained presumed etiology of IBS symptoms. Patient was counseled about the benefit of implementing a low FODMAP to improve symptoms and recurrent episodes. A dietary list was provided to the patient. Also, the patient was counseled about the benefit of avoiding stressing situations and potential environmental triggers leading to symptomatology. - Start Levsin 1 tablet q8h as needed for abdominal pain - Schedule modified barium swallow - Can try Plant Enzymes if you want to eat vegetables -Continue Phenergan as needed for nausea -Continue MiraLAX daily - Stop pantoprazole and start omeprazole 40 mg twice daily - Check CBC and iron stores - May consider capsule endoscopy if  hemoglobin and iron stores are persistently low  All questions were answered.      Harvel Quale, MD Gastroenterology and Hepatology [facility]

## 2020-10-24 NOTE — Patient Instructions (Addendum)
Explained presumed etiology of IBS symptoms. Patient was counseled about the benefit of implementing a low FODMAP to improve symptoms and recurrent episodes. A dietary list was provided to the patient. Also, the patient was counseled about the benefit of avoiding stressing situations and potential environmental triggers leading to symptomatology. Start Levsin 1 tablet q8h as needed for abdominal pain Schedule modified barium swallow Can try Plant Enzymes if you want to eat vegetables Stop pantoprazole and start omeprazole

## 2020-11-01 ENCOUNTER — Telehealth (INDEPENDENT_AMBULATORY_CARE_PROVIDER_SITE_OTHER): Payer: Self-pay

## 2020-11-01 NOTE — Telephone Encounter (Signed)
Patient was confused that we had mailed her lab orders to her in the mail instead of handing them to her here at the office visit. She is aware to have these labs drawn at her convince.

## 2020-11-01 NOTE — Telephone Encounter (Signed)
Patient called the office stating she received some paper she doesn't know what they are for.

## 2020-11-05 DIAGNOSIS — D509 Iron deficiency anemia, unspecified: Secondary | ICD-10-CM | POA: Diagnosis not present

## 2020-11-06 LAB — CBC WITH DIFFERENTIAL/PLATELET
Absolute Monocytes: 679 cells/uL (ref 200–950)
Basophils Absolute: 84 cells/uL (ref 0–200)
Basophils Relative: 0.9 %
Eosinophils Absolute: 112 cells/uL (ref 15–500)
Eosinophils Relative: 1.2 %
HCT: 43.4 % (ref 35.0–45.0)
Hemoglobin: 13.9 g/dL (ref 11.7–15.5)
Lymphs Abs: 2288 cells/uL (ref 850–3900)
MCH: 27.9 pg (ref 27.0–33.0)
MCHC: 32 g/dL (ref 32.0–36.0)
MCV: 87 fL (ref 80.0–100.0)
MPV: 11.3 fL (ref 7.5–12.5)
Monocytes Relative: 7.3 %
Neutro Abs: 6138 cells/uL (ref 1500–7800)
Neutrophils Relative %: 66 %
Platelets: 300 10*3/uL (ref 140–400)
RBC: 4.99 10*6/uL (ref 3.80–5.10)
RDW: 16.3 % — ABNORMAL HIGH (ref 11.0–15.0)
Total Lymphocyte: 24.6 %
WBC: 9.3 10*3/uL (ref 3.8–10.8)

## 2020-11-06 LAB — FERRITIN: Ferritin: 13 ng/mL — ABNORMAL LOW (ref 16–288)

## 2020-11-06 LAB — IRON, TOTAL/TOTAL IRON BINDING CAP
%SAT: 15 % (calc) — ABNORMAL LOW (ref 16–45)
Iron: 51 ug/dL (ref 45–160)
TIBC: 330 mcg/dL (calc) (ref 250–450)

## 2020-11-08 ENCOUNTER — Other Ambulatory Visit (HOSPITAL_COMMUNITY): Payer: Self-pay | Admitting: Specialist

## 2020-11-08 DIAGNOSIS — T17308S Unspecified foreign body in larynx causing other injury, sequela: Secondary | ICD-10-CM

## 2020-11-08 DIAGNOSIS — D509 Iron deficiency anemia, unspecified: Secondary | ICD-10-CM | POA: Diagnosis not present

## 2020-11-08 DIAGNOSIS — E039 Hypothyroidism, unspecified: Secondary | ICD-10-CM | POA: Diagnosis not present

## 2020-11-14 DIAGNOSIS — M5416 Radiculopathy, lumbar region: Secondary | ICD-10-CM | POA: Diagnosis not present

## 2020-11-15 ENCOUNTER — Ambulatory Visit (HOSPITAL_COMMUNITY): Payer: Medicare Other | Admitting: Speech Pathology

## 2020-11-15 ENCOUNTER — Other Ambulatory Visit (HOSPITAL_COMMUNITY): Payer: Medicare Other

## 2020-11-22 ENCOUNTER — Other Ambulatory Visit: Payer: Self-pay

## 2020-11-22 ENCOUNTER — Ambulatory Visit (HOSPITAL_COMMUNITY)
Admission: RE | Admit: 2020-11-22 | Discharge: 2020-11-22 | Disposition: A | Discharge status: Home / Self Care | Payer: Medicare Other | Source: Ambulatory Visit | Attending: Internal Medicine | Admitting: Internal Medicine

## 2020-11-22 ENCOUNTER — Ambulatory Visit (HOSPITAL_COMMUNITY): Payer: Medicare Other | Attending: Gastroenterology | Admitting: Speech Pathology

## 2020-11-22 ENCOUNTER — Encounter (HOSPITAL_COMMUNITY): Payer: Self-pay | Admitting: Speech Pathology

## 2020-11-22 DIAGNOSIS — T17308S Unspecified foreign body in larynx causing other injury, sequela: Secondary | ICD-10-CM | POA: Diagnosis not present

## 2020-11-22 DIAGNOSIS — T17210A Gastric contents in pharynx causing asphyxiation, initial encounter: Secondary | ICD-10-CM | POA: Diagnosis not present

## 2020-11-22 DIAGNOSIS — R1310 Dysphagia, unspecified: Secondary | ICD-10-CM | POA: Diagnosis not present

## 2020-11-22 NOTE — Therapy (Signed)
[place], [place], [address] Phone: [phone]   Fax:  [phone]  Modified Barium Swallow  Patient Details  Name: Nicole Simon MRN: [id number] Date of Birth: 07-Oct-1933 No data recorded  Encounter Date: 11/22/2020   End of Session - 11/22/20 1221     Visit Number 1    Number of Visits 1    SLP Start Time 1003    SLP Stop Time  1028    SLP Time Calculation (min) 25 min    Activity Tolerance Patient tolerated treatment well            Past Medical History:  Past Medical History:  Diagnosis Date   Arthritis    Depression    GERD (gastroesophageal reflux disease)    Hypertension    Hypothyroidism    Peripheral neuropathy    Past Surgical History:  Past Surgical History:  Procedure Laterality Date   ABDOMINAL HYSTERECTOMY     APPENDECTOMY     BACK SURGERY     BIOPSY  07/23/2020   Procedure: BIOPSY;  Surgeon: Harvel Quale, MD;  [facility];  Service: Gastroenterology;;   COLONOSCOPY     COLONOSCOPY  02/17/2012   Procedure: COLONOSCOPY;  Surgeon: Rogene Houston, MD;  [facility];  Service: Endoscopy;  Laterality: N/A;  1030   COLONOSCOPY N/A 10/05/2014   Procedure: COLONOSCOPY;  Surgeon: Rogene Houston, MD;  [facility];  Service: Endoscopy;  Laterality: N/A;  855   COLONOSCOPY N/A 12/11/2016   Procedure: COLONOSCOPY;  Surgeon: Rogene Houston, MD;  [facility];  Service: Endoscopy;  Laterality: N/A;  155   COLONOSCOPY WITH PROPOFOL N/A 07/23/2020   Procedure: COLONOSCOPY WITH PROPOFOL;  Surgeon: Harvel Quale, MD;  [facility];  Service: Gastroenterology;  Laterality: N/A;  am   DILATION AND CURETTAGE OF UTERUS     ESOPHAGEAL DILATION N/A 10/05/2014   Procedure: ESOPHAGEAL DILATION;  Surgeon: Rogene Houston, MD;  [facility];  Service: Endoscopy;  Laterality: N/A;   ESOPHAGOGASTRODUODENOSCOPY N/A 10/05/2014    Procedure: ESOPHAGOGASTRODUODENOSCOPY (EGD);  Surgeon: Rogene Houston, MD;  [facility];  Service: Endoscopy;  Laterality: N/A;   ESOPHAGOGASTRODUODENOSCOPY N/A 12/11/2016   Procedure: ESOPHAGOGASTRODUODENOSCOPY (EGD);  Surgeon: Rogene Houston, MD;  [facility];  Service: Endoscopy;  Laterality: N/A;   ESOPHAGOGASTRODUODENOSCOPY (EGD) WITH PROPOFOL N/A 07/23/2020   Procedure: ESOPHAGOGASTRODUODENOSCOPY (EGD) WITH PROPOFOL;  Surgeon: Harvel Quale, MD;  [facility];  Service: Gastroenterology;  Laterality: N/A;   THYROIDECTOMY     HPI: Nicole Simon is a 87 y.o.  female with past medical history of IBS-C, GERD, depression, arthritis, hypertension, hypothyroidism and peripheral neuropathy,  who presented to GI for follow up of nausea, constipation, abdominal discomfort and heartburn. At that appointment she reports the in the last month she has presented some choking episodes with chips or bread. Pt's primary complaint to SLP is globus sensation. MBSS requested   No data recorded   Assessment / Plan / Recommendation  CHL IP CLINICAL IMPRESSIONS 11/22/2020  Clinical Impression Pt's oropharyngeal swallowing presented today to be grossly WNL. Pt presents with good laryngeal vestibule closure, adequate hyolaryngeal excursion and adequate airway protection; no aspiration was visualized. Note occasional flash penetration of thin liquids and occasional trace residue of thin liquids in the valleculae after the swallow that are reflexively cleared by a repeat swallow. Pt reports  globus sensation throughout the study despite no visualized residue; esophageal sweep was unremarkable. Radiologist present to confirm. Recommend continue with regular diet and thin liquids with esophageal precautions that were reviewed with the Pt. Defer to GI for further treatment of esophageal symptoms and discomfort. No ST f/u is recommended at this time. Thank you for this referral.   SLP Visit Diagnosis Dysphagia, unspecified (R13.10)  Attention and concentration deficit following --  Frontal lobe and executive function deficit following --  Impact on safety and function No limitations      No flowsheet data found.   No flowsheet data found.  CHL IP DIET RECOMMENDATION 11/22/2020  SLP Diet Recommendations Regular solids;Thin liquid  Liquid Administration via Straw;Cup  Medication Administration Whole meds with liquid  Compensations Minimize environmental distractions;Slow rate;Small sips/bites  Postural Changes Seated upright at 90 degrees;Remain semi-upright after after feeds/meals (Comment)      CHL IP OTHER RECOMMENDATIONS 11/22/2020  Recommended Consults --  Oral Care Recommendations Oral care BID  Other Recommendations --      No flowsheet data found.    No flowsheet data found.         CHL IP ORAL PHASE 11/22/2020  Oral Phase WFL  Oral - Pudding Teaspoon --  Oral - Pudding Cup --  Oral - Honey Teaspoon --  Oral - Honey Cup --  Oral - Nectar Teaspoon --  Oral - Nectar Cup --  Oral - Nectar Straw --  Oral - Thin Teaspoon --  Oral - Thin Cup --  Oral - Thin Straw --  Oral - Puree --  Oral - Mech Soft --  Oral - Regular --  Oral - Multi-Consistency --  Oral - Pill --  Oral Phase - Comment --    CHL IP PHARYNGEAL PHASE 11/22/2020  Pharyngeal Phase WFL  Pharyngeal- Pudding Teaspoon --  Pharyngeal --  Pharyngeal- Pudding Cup --  Pharyngeal --  Pharyngeal- Honey Teaspoon --  Pharyngeal --  Pharyngeal- Honey Cup --  Pharyngeal --  Pharyngeal- Nectar Teaspoon --  Pharyngeal --  Pharyngeal- Nectar Cup --  Pharyngeal --  Pharyngeal- Nectar Straw --  Pharyngeal --  Pharyngeal- Thin Teaspoon --  Pharyngeal --  Pharyngeal- Thin Cup --  Pharyngeal --  Pharyngeal- Thin Straw --  Pharyngeal --  Pharyngeal- Puree --  Pharyngeal --  Pharyngeal- Mechanical Soft --  Pharyngeal --  Pharyngeal- Regular --  Pharyngeal --  Pharyngeal-  Multi-consistency --  Pharyngeal --  Pharyngeal- Pill --  Pharyngeal --  Pharyngeal Comment --     CHL IP CERVICAL ESOPHAGEAL PHASE 11/22/2020  Cervical Esophageal Phase WFL  Pudding Teaspoon --  Pudding Cup --  Honey Teaspoon --  Honey Cup --  Nectar Teaspoon --  Nectar Cup --  Nectar Straw --  Thin Teaspoon --  Thin Cup --  Thin Straw --  Puree --  Mechanical Soft --  Regular --  Multi-consistency --  Pill --  Cervical Esophageal Comment --   Nicole Duncombe H. Roddie Mc, CCC-SLP Speech Language Pathologist   Wende Bushy 11/22/2020, 12:22 PM                        Wende Bushy 11/22/2020, 12:22 PM  Simon [address] [place], [place], [address] Phone: [phone]   Fax:  [phone]  Name: Nicole Simon MRN: [id number] Date of Birth: [birth year]

## 2020-12-02 ENCOUNTER — Other Ambulatory Visit: Payer: Self-pay | Admitting: Internal Medicine

## 2020-12-02 DIAGNOSIS — Z139 Encounter for screening, unspecified: Secondary | ICD-10-CM

## 2020-12-03 DIAGNOSIS — M4316 Spondylolisthesis, lumbar region: Secondary | ICD-10-CM | POA: Diagnosis not present

## 2020-12-03 DIAGNOSIS — M1712 Unilateral primary osteoarthritis, left knee: Secondary | ICD-10-CM | POA: Diagnosis not present

## 2020-12-17 DIAGNOSIS — I700 Atherosclerosis of aorta: Secondary | ICD-10-CM | POA: Diagnosis not present

## 2020-12-17 DIAGNOSIS — Z299 Encounter for prophylactic measures, unspecified: Secondary | ICD-10-CM | POA: Diagnosis not present

## 2020-12-17 DIAGNOSIS — R202 Paresthesia of skin: Secondary | ICD-10-CM | POA: Diagnosis not present

## 2020-12-17 DIAGNOSIS — I10 Essential (primary) hypertension: Secondary | ICD-10-CM | POA: Diagnosis not present

## 2020-12-17 DIAGNOSIS — M461 Sacroiliitis, not elsewhere classified: Secondary | ICD-10-CM | POA: Diagnosis not present

## 2020-12-31 DIAGNOSIS — M545 Low back pain, unspecified: Secondary | ICD-10-CM | POA: Diagnosis not present

## 2021-01-07 ENCOUNTER — Encounter (INDEPENDENT_AMBULATORY_CARE_PROVIDER_SITE_OTHER): Payer: Self-pay | Admitting: Gastroenterology

## 2021-01-07 ENCOUNTER — Ambulatory Visit (INDEPENDENT_AMBULATORY_CARE_PROVIDER_SITE_OTHER): Payer: Medicare Other | Admitting: Gastroenterology

## 2021-01-07 ENCOUNTER — Other Ambulatory Visit: Payer: Self-pay

## 2021-01-07 VITALS — BP 171/78 | HR 80 | Temp 98.1°F | Ht 61.0 in | Wt 136.2 lb

## 2021-01-07 DIAGNOSIS — K581 Irritable bowel syndrome with constipation: Secondary | ICD-10-CM

## 2021-01-07 DIAGNOSIS — D649 Anemia, unspecified: Secondary | ICD-10-CM | POA: Insufficient documentation

## 2021-01-07 DIAGNOSIS — K219 Gastro-esophageal reflux disease without esophagitis: Secondary | ICD-10-CM

## 2021-01-07 DIAGNOSIS — R110 Nausea: Secondary | ICD-10-CM | POA: Diagnosis not present

## 2021-01-07 DIAGNOSIS — D508 Other iron deficiency anemias: Secondary | ICD-10-CM

## 2021-01-07 LAB — CBC
HCT: 43.8 % (ref 35.0–45.0)
Hemoglobin: 14.4 g/dL (ref 11.7–15.5)
MCH: 29.8 pg (ref 27.0–33.0)
MCHC: 32.9 g/dL (ref 32.0–36.0)
MCV: 90.5 fL (ref 80.0–100.0)
MPV: 11.2 fL (ref 7.5–12.5)
Platelets: 310 10*3/uL (ref 140–400)
RBC: 4.84 10*6/uL (ref 3.80–5.10)
RDW: 14.1 % (ref 11.0–15.0)
WBC: 8.6 10*3/uL (ref 3.8–10.8)

## 2021-01-07 MED ORDER — PRUCALOPRIDE SUCCINATE 1 MG PO TABS: 1.0000 mg | ORAL_TABLET | Freq: Once | ORAL | 1 refills | Status: AC

## 2021-01-07 NOTE — Patient Instructions (Signed)
~  Please take diclyclomine (Bentyl) once daily in the morning, you may take another dose in the evening-this is for you abdominal pain ~I will send Motegrity '1mg'$  for constipation to your pharmacy, if too expensive, give [place] a call and we will try something else, please stop miralax when taking motegrity. ~Continue omeprazole once in the morning 30 mins before eating and again 30 mins before dinner.  ~you can get Famotidine (pepcid) '20mg'$  over the counter, take as needed in the evenings for reflux/heartburn ~we will check your hemoglobin today to make sure no anemia is present ~continue taking your iron pill once a day ~continue to take small bites of food, chew thoroughly, with sips of liquid after. Please stay seating upright 1-2 hours after eating.  Follow up 3 months

## 2021-01-07 NOTE — Progress Notes (Signed)
Referring Provider: Glenda Chroman, MD Primary Care Physician:  Glenda Chroman, MD Primary GI Physician: Jenetta Downer  Chief Complaint  Patient presents with   Follow-up    Patient here today for a follow up visit. Patient reports she has several Gi issues she wanted to discuss today. She has some nausea,She has lower abdominal pain and gas,bloating and heartburn she takes omeprazole bid, which seems to not be of any help. She has one bm per day if she takes Miralax. She states her appetite has decreased. She has issues with her spine and is unable to have surgery and wonders if this is related to her GI symptoms.     HPI:   Nicole Simon is a 87 y.o. female with past medical history of IBS-C, GERD, depression, arthritis, HTN, hypothyroidism, and peripheral neuropathy.   Patient presenting today for follow up of ongoing nausea, constipation, heartburn and abdominal pain.   lower abdominal pain: ongoing, has bentyl but states she rarely takes it and is unsure if it helps her symptoms or not. Lower abdominal pain feels burning in nature. Not improved with BMs. Endorses bloating in lower abdomen as well, loves veggies, but these seem to make her symptoms worse.   Nausea:ongoing.she has Phenergan 12.'5mg'$  that she typically takes maybe once per week. She reports that certain foods especially some veggies and greasy foods make her nausea worse. Rare episodes of vomiting.   GERD: omeprazole '40mg'$  BID not having much relief with this, dexilant prescribed previously, but it was too expensive so she was unable to get it. She takes morning dose of PPI and usually does not eat breakfast, takes second dose before bed. Having frequent reflux, mostly daily with a lot of belching. Denies dysphagia or odynophagia. Very occasional issues with food getting stuck, MBS in July 2022 was fairly unremarkable.   IDA: IDA during blood work performed by PCP at beginning of the year. recent EGD/colonoscopy in March 2022  without sig findings to explain IDA. Currently on ferrous sulfate '325mg'$ , most recent labs 11/05/20 with hgb 13.9, iron and TIBC WNL, Ferritin slightly low at 13. Was decided after last visit that patient would hold off on capsule study as labs indicated iron deficiency but no evidence of anemia. Denies melena, hematochezia, fatigue. She endorses some dizziness and shortness of breath, but states these are ongoing and not new.  No red flag symptoms. Patient denies melena, hematochezia, diarrhea,  dysphagia, odyonophagia, early satiety or weight loss.   Modified barium swallow study: 11/22/20 oropharyngeal swallowing grossly WNL. Good laryngeal vestibule closure, adequate dequate hyolaryngeal excursion and adequate airway protection; no aspiration visualized. Note occasional flash penetration of thin liquids and occasional trace residue of thin liquids in the valleculae after the swallow that are reflexively cleared by a repeat swallow. Pt reports globus sensation throughout the study despite no visualized residue; esophageal sweep was unremarkable. Radiologist present to confirm. Recommend continue with regular diet and thin liquids with esophageal precautions that were reviewed with the pt. 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 The visceral arteries are otherwise widely patent without CT findings of mesenteric ischemia. -Mild ostial stenosis of the right renal artery secondary to atherosclerotic plaque.   NON-VASCULAR 1. Similar appearing moderate sized hiatal hernia without evidence of strangulation. 2. Large colonic stool burden.  Last Colonoscopy:07/23/20- The examined portion of the ileum was normal. - Diverticulosis in the sigmoid colon and in  the descending colon. - Non-bleeding internal hemorrhoids. - No specimens collected.  Last Endoscopy:3/22/225 cm hiatal  hernia. - Multiple gastric polyps. These were consistent with fundic gland polyps. - Normal examined duodenum. (normal biopsy)   Past Medical History:  Diagnosis Date   Arthritis    Depression    GERD (gastroesophageal reflux disease)    Hypertension    Hypothyroidism    Peripheral neuropathy     Past Surgical History:  Procedure Laterality Date   ABDOMINAL HYSTERECTOMY     APPENDECTOMY     BACK SURGERY     BIOPSY  07/23/2020   Procedure: BIOPSY;  Surgeon: Harvel Quale, MD;  [facility];  Service: Gastroenterology;;   COLONOSCOPY     COLONOSCOPY  02/17/2012   Procedure: COLONOSCOPY;  Surgeon: Rogene Houston, MD;  [facility]: [facility];  Service: Endoscopy;  Laterality: N/A;  1030   COLONOSCOPY N/A 10/05/2014   Procedure: COLONOSCOPY;  Surgeon: Rogene Houston, MD;  [facility]: [facility];  Service: Endoscopy;  Laterality: N/A;  855   COLONOSCOPY N/A 12/11/2016   Procedure: COLONOSCOPY;  Surgeon: Rogene Houston, MD;  [facility]: [facility];  Service: Endoscopy;  Laterality: N/A;  155   COLONOSCOPY WITH PROPOFOL N/A 07/23/2020   Procedure: COLONOSCOPY WITH PROPOFOL;  Surgeon: Harvel Quale, MD;  [facility]: [facility];  Service: Gastroenterology;  Laterality: N/A;  am   DILATION AND CURETTAGE OF UTERUS     ESOPHAGEAL DILATION N/A 10/05/2014   Procedure: ESOPHAGEAL DILATION;  Surgeon: Rogene Houston, MD;  [facility];  Service: Endoscopy;  Laterality: N/A;   ESOPHAGOGASTRODUODENOSCOPY N/A 10/05/2014   Procedure: ESOPHAGOGASTRODUODENOSCOPY (EGD);  Surgeon: Rogene Houston, MD;  [facility];  Service: Endoscopy;  Laterality: N/A;   ESOPHAGOGASTRODUODENOSCOPY N/A 12/11/2016   Procedure: ESOPHAGOGASTRODUODENOSCOPY (EGD);  Surgeon: Rogene Houston, MD;  [facility];  Service: Endoscopy;  Laterality: N/A;   ESOPHAGOGASTRODUODENOSCOPY (EGD) WITH PROPOFOL N/A 07/23/2020   Procedure: ESOPHAGOGASTRODUODENOSCOPY (EGD)  WITH PROPOFOL;  Surgeon: Harvel Quale, MD;  [facility];  Service: Gastroenterology;  Laterality: N/A;   THYROIDECTOMY      Current Outpatient Medications  Medication Sig Dispense Refill   amLODipine (NORVASC) 5 MG tablet Take 5 mg by mouth daily.     aspirin EC 81 MG tablet Take 81 mg by mouth daily.     cetirizine (ZYRTEC) 10 MG tablet Take 10 mg by mouth as needed for allergies.     dicyclomine (BENTYL) 10 MG capsule Take 1 capsule (10 mg total) by mouth every 12 (twelve) hours as needed for spasms. 60 capsule 2   ferrous sulfate 325 (65 FE) MG tablet Take 1 tablet (325 mg total) by mouth daily with breakfast. 90 tablet 1   gabapentin (NEURONTIN) 100 MG capsule Take 100 mg by mouth at bedtime.  12   hydroxychloroquine (PLAQUENIL) 200 MG tablet Take 200 mg by mouth daily.      Levothyroxine Sodium 100 MCG CAPS Take 100 mcg by mouth daily before breakfast.     losartan (COZAAR) 100 MG tablet Take 100 mg by mouth daily.     omeprazole (PRILOSEC) 40 MG capsule Take 1 capsule (40 mg total) by mouth 2 (two) times daily. 180 capsule 3   polyethylene glycol (MIRALAX / GLYCOLAX) 17 g packet Take 17 g by mouth daily.     potassium chloride (K-DUR) 10 MEQ tablet Take 10 mEq by mouth daily.  promethazine (PHENERGAN) 12.5 MG tablet Take 1 tablet (12.5 mg total) by mouth every 6 (six) hours as needed for nausea or vomiting. 30 tablet 2   simvastatin (ZOCOR) 20 MG tablet Take 20 mg by mouth at bedtime.     vortioxetine HBr (TRINTELLIX) 10 MG TABS tablet Take 10 mg by mouth daily.     No current facility-administered medications for this visit.    Allergies as of 01/07/2021 - Review Complete 01/07/2021  Allergen Reaction Noted   Penicillins Hives and Itching 02/09/2012    Family History  Problem Relation Age of Onset   Colon cancer Neg Hx     Social History   Socioeconomic History   Marital status: Widowed    Spouse name: Not on file   Number of children: Not on  file   Years of education: Not on file   Highest education level: Not on file  Occupational History   Not on file  Tobacco Use   Smoking status: Never   Smokeless tobacco: Never  Vaping Use   Vaping Use: Never used  Substance and Sexual Activity   Alcohol use: No    Alcohol/week: 0.0 standard drinks   Drug use: No   Sexual activity: Not on file  Other Topics Concern   Not on file  Social History Narrative   Not on file   Social Determinants of Health   Financial Resource Strain: Not on file  Food Insecurity: Not on file  Transportation Needs: Not on file  Physical Activity: Not on file  Stress: Not on file  Social Connections: Not on file    Review of Systems: Gen: Denies fever, chills, anorexia. Denies fatigue, weakness, weight loss.  CV: Denies chest pain, palpitations, syncope, peripheral edema, and claudication. Resp: Denies dyspnea at rest, cough, wheezing, coughing up blood, and pleurisy. GI: Denies vomiting blood, jaundice, and fecal incontinence. Denies dysphagia or odynophagia.+constipation, +lower abdominal pain, +bloating, +reflux Derm: Denies rash, itching, dry skin Psych: Denies depression, anxiety, memory loss, confusion. No homicidal or suicidal ideation.  Heme: Denies bruising, bleeding, and enlarged lymph nodes.  Physical Exam: BP (!) 171/78 (BP Location: Left Arm, Patient Position: Sitting, Cuff Size: Small)   Pulse 80   Temp 98.1 F (36.7 C) (Oral)   Ht '5\' 1"'$  (1.549 m)   Wt 136 lb 3.2 oz (61.8 kg)   BMI 25.73 kg/m  General:   Alert and oriented. No distress noted. Pleasant and cooperative.  Head:  Normocephalic and atraumatic. Eyes:  Conjuctiva clear without scleral icterus. Mouth:  Oral mucosa pink and moist. Good dentition. No lesions. Heart: Normal rate and rhythm, s1 and s2 heart sounds present.  Lungs: Clear lung sounds in all lobes. Respirations equal and unlabored. Abdomen:  +BS, soft, and non-distended. No rebound or guarding. No HSM or  masses noted. Mild TTP mid to left lower abdomen Derm: No palmar erythema or jaundice Msk:  Symmetrical without gross deformities. Normal posture. Extremities:  Without edema. Neurologic:  Alert and  oriented x4 Psych:  Alert and cooperative. Normal mood and affect.  Invalid input(s): 6 MONTHS   ASSESSMENT: Nicole Simon is a 87 y.o. female presenting today for follow up of GERD, nausea, abdominal pain and constipation.  Patient continues to report ongoing lower, burning abdominal pain, nausea, bloating and reflux symptoms.   Nausea is ongoing, typically worse with greasy foods and certain veggies such as brussell sprouts. She also endorses some bloating, very occasional vomiting. Was advised to try plant enzymes previously but states  she did not. Has phenergan 12.'5mg'$  but rarely takes this, maybe once per month, despite frequent nausea almost daily. Can take phenergan every 6 hours as needed.  Reflux not well controlled at this time. She is Taking omeprazole '40mg'$  in the morning and again at bedtime. Advised patient to make sure morning dose is 30 mins before eating and evening dose is 30 mins before dinner. We will add in otc pepcid '20mg'$  as needed in the evenings to see if reflux is better managed. Dexilant was not covered previously and was too expensive for patient to afford.  Taking bentyl rarely for abdominal pain, is unsure if it helps her symptoms or not. States pain is burning in nature. She is having maybe one good BM per day with miralax but does not feel it is sufficient. Rare episodes of breakthrough diarrhea. Tried linzess in the past with intolerance. We will try motegrity '1mg'$ , given patients ongoing nausea and reflux, this may also help with those. She should also take bentyl, one in the morning, can repeat in the evenings if abdominal pain is not well controlled with just once daily dosing.  Previous anemia, recent EGD/colonoscopy in March 2022 without significant findings to  explain IDA. Currently on ferrous sulfate '325mg'$ , most recent labs 11/05/20 with hgb 13.9, iron and TIBC WNL, Ferritin slightly low at 13. patient decided after last visit, she would like to hold off on capsule study at that time as labs indicated iron deficiency but no evidence of anemia. Denies melena, hematochezia, fatigue. She endorses some dizziness and shortness of breath, but states these are ongoing and not new.we will check CBC today, continue ferrous sulfate '325mg'$  daily.  I suspect much of patient's complaints are related to her inefficient bowel movements and food choices. We discussed that working to get constipation improved and avoiding foods that seems to cause her symptoms to worsen are important. She reports a low appetite, can do boost or ensure 2-3x/day to prevent weight loss if she is not feeling like eating.  No red flag symptoms. Patient denies melena, hematochezia, diarrhea,  dysphagia, odyonophagia, early satiety or weight loss.   PLAN:  Take Bentyl '10mg'$  1-2 x daily for abdominal pain 2. Trial Motegrity '1mg'$  for constipation, stop miralax 3. Continue omeprazole once in the morning 30 mins before eating and again 30 mins before dinner.  4. Chewing precautions, small bites, chew thoroughly, small sips between bits, stay upright 1-2 hours after eating 5. Will start otc pepcid at night as needed. 6. Please avoid foods that are greasy, spicy, and that seem to cause more bloating  Follow Up: 3 months.  Philbert Ocallaghan L. Alver Sorrow, MSN, APRN, AGNP-C Adult-Gerontology Nurse Practitioner [facility]

## 2021-01-09 ENCOUNTER — Telehealth (INDEPENDENT_AMBULATORY_CARE_PROVIDER_SITE_OTHER): Payer: Self-pay | Admitting: *Deleted

## 2021-01-09 ENCOUNTER — Other Ambulatory Visit (INDEPENDENT_AMBULATORY_CARE_PROVIDER_SITE_OTHER): Payer: Self-pay | Admitting: Gastroenterology

## 2021-01-09 MED ORDER — LUBIPROSTONE 8 MCG PO CAPS: 8.0000 ug | ORAL_CAPSULE | Freq: Two times a day (BID) | ORAL | 1 refills | Status: DC

## 2021-01-09 NOTE — Telephone Encounter (Signed)
Pt.notified

## 2021-01-09 NOTE — Telephone Encounter (Signed)
Pt states motegrity 1 mg is too expensive. Would like to change to something else. Pt seen 01/07/21  [facility]

## 2021-01-13 ENCOUNTER — Telehealth (INDEPENDENT_AMBULATORY_CARE_PROVIDER_SITE_OTHER): Payer: Self-pay | Admitting: *Deleted

## 2021-01-13 ENCOUNTER — Other Ambulatory Visit (INDEPENDENT_AMBULATORY_CARE_PROVIDER_SITE_OTHER): Payer: Self-pay | Admitting: *Deleted

## 2021-01-13 MED ORDER — TRULANCE 3 MG PO TABS: 3.0000 mg | ORAL_TABLET | Freq: Every day | ORAL | 0 refills | Status: DC

## 2021-01-13 NOTE — Telephone Encounter (Signed)
error 

## 2021-01-13 NOTE — Telephone Encounter (Signed)
Called and discussed with pt. Pt would like to try trulance. Samples at the front window. Pt understands that if any issues with trulance then she will need to try miralax.

## 2021-01-13 NOTE — Telephone Encounter (Signed)
Pt states she tried taking amitiza 8 mcg one bid and states she took for 3 days and it makes her heave and vomit.   [facility] in [place].   [phone]

## 2021-01-14 ENCOUNTER — Other Ambulatory Visit: Payer: Self-pay

## 2021-01-14 ENCOUNTER — Ambulatory Visit
Admission: RE | Admit: 2021-01-14 | Discharge: 2021-01-14 | Disposition: A | Discharge status: Home / Self Care | Payer: Medicare Other | Source: Ambulatory Visit | Attending: Internal Medicine | Admitting: Internal Medicine

## 2021-01-14 DIAGNOSIS — Z139 Encounter for screening, unspecified: Secondary | ICD-10-CM

## 2021-01-14 DIAGNOSIS — Z1231 Encounter for screening mammogram for malignant neoplasm of breast: Secondary | ICD-10-CM | POA: Diagnosis not present

## 2021-01-31 DIAGNOSIS — Z79899 Other long term (current) drug therapy: Secondary | ICD-10-CM | POA: Diagnosis not present

## 2021-01-31 DIAGNOSIS — H5202 Hypermetropia, left eye: Secondary | ICD-10-CM | POA: Diagnosis not present

## 2021-01-31 DIAGNOSIS — Z961 Presence of intraocular lens: Secondary | ICD-10-CM | POA: Diagnosis not present

## 2021-02-18 DIAGNOSIS — Z6824 Body mass index (BMI) 24.0-24.9, adult: Secondary | ICD-10-CM | POA: Diagnosis not present

## 2021-02-18 DIAGNOSIS — M154 Erosive (osteo)arthritis: Secondary | ICD-10-CM | POA: Diagnosis not present

## 2021-02-18 DIAGNOSIS — M545 Low back pain, unspecified: Secondary | ICD-10-CM | POA: Diagnosis not present

## 2021-02-18 DIAGNOSIS — M150 Primary generalized (osteo)arthritis: Secondary | ICD-10-CM | POA: Diagnosis not present

## 2021-02-18 DIAGNOSIS — Z79899 Other long term (current) drug therapy: Secondary | ICD-10-CM | POA: Diagnosis not present

## 2021-02-18 DIAGNOSIS — M2550 Pain in unspecified joint: Secondary | ICD-10-CM | POA: Diagnosis not present

## 2021-04-01 DIAGNOSIS — I10 Essential (primary) hypertension: Secondary | ICD-10-CM | POA: Diagnosis not present

## 2021-04-01 DIAGNOSIS — J309 Allergic rhinitis, unspecified: Secondary | ICD-10-CM | POA: Diagnosis not present

## 2021-04-01 DIAGNOSIS — D692 Other nonthrombocytopenic purpura: Secondary | ICD-10-CM | POA: Diagnosis not present

## 2021-04-01 DIAGNOSIS — Z299 Encounter for prophylactic measures, unspecified: Secondary | ICD-10-CM | POA: Diagnosis not present

## 2021-05-06 ENCOUNTER — Encounter (INDEPENDENT_AMBULATORY_CARE_PROVIDER_SITE_OTHER): Payer: Self-pay | Admitting: Internal Medicine

## 2021-05-06 ENCOUNTER — Ambulatory Visit (INDEPENDENT_AMBULATORY_CARE_PROVIDER_SITE_OTHER): Payer: Medicare Other | Admitting: Internal Medicine

## 2021-05-06 ENCOUNTER — Other Ambulatory Visit: Payer: Self-pay

## 2021-05-06 VITALS — BP 158/68 | HR 93 | Temp 98.6°F | Ht 61.0 in | Wt 137.4 lb

## 2021-05-06 DIAGNOSIS — K219 Gastro-esophageal reflux disease without esophagitis: Secondary | ICD-10-CM | POA: Diagnosis not present

## 2021-05-06 DIAGNOSIS — R1319 Other dysphagia: Secondary | ICD-10-CM | POA: Diagnosis not present

## 2021-05-06 DIAGNOSIS — K5900 Constipation, unspecified: Secondary | ICD-10-CM | POA: Diagnosis not present

## 2021-05-06 MED ORDER — ESOMEPRAZOLE MAGNESIUM 40 MG PO CPDR: 40.0000 mg | DELAYED_RELEASE_CAPSULE | Freq: Every day | ORAL | 5 refills | Status: DC

## 2021-05-06 MED ORDER — TRULANCE 3 MG PO TABS: 3.0000 mg | ORAL_TABLET | Freq: Every morning | ORAL | 0 refills | Status: DC

## 2021-05-06 NOTE — Progress Notes (Signed)
Presenting complaint;  Follow for chronic GERD and constipation. Patient complains of dysphagia.  Database and subjective:  Patient is 88 year old Caucasian female who was here for scheduled visit.  She has chronic GERD, constipation IBS who found to be anemic by Dr. Woody Seller.  Anemia was subsequently confirmed to be due to iron deficiency.  No history of melena or rectal bleeding.  She underwent EGD and colonoscopy by Dr. Jenetta Downer and March 2022.  EGD revealed 6 cm size sliding hiatal hernia but no bleeding lesion was found.  Duodenal biopsy was negative and colonoscopy revealed left-sided diverticulosis.  Her anemia resolved with oral iron supplement.  She had normal H&H in September 2022. She was seen in the office by Ms. Scherrie Gerlach, NP on 01/07/2021.  She was not having regular bowel movements and she was experiencing lower abdominal pain and some nausea.  She was given prescription for Motegrity.  Patient apparently did not fill this medication.  Patient states pantoprazole is not working anymore.  She has heartburn at least couple times a week.  Now she is also complaining of dysphagia.  Dysphagia is primarily to solids and she points to upper sternal area as site of bolus obstruction.  She continues to experience constipation.  She says she had normal stool yesterday and today with 2 days ago her stool was very hard.  Sometimes she has difficulty pushing the stool out. She does not recall anything about Motegrity.  It appears she did not  She is not taking iron anymore.  Fill this medication.  She has taken Linzess in the past but did not have satisfactory results.  She also experienced some urgency and could not control her bowel movements. Patient states her appetite is fair.  She has not lost any weight since her last visit 3 months ago. She says she just returned from trip to [place] where she was for 2 and half weeks.  She lives alone in [place].  She has 4 sisters with she does not see them  often.  She is very close to 2 of her grandchildren and one of the other calls her almost every day.  Current Medications: Outpatient Encounter Medications as of 05/06/2021  Medication Sig   amLODipine (NORVASC) 5 MG tablet Take 5 mg by mouth daily.   aspirin EC 81 MG tablet Take 81 mg by mouth daily.   cetirizine (ZYRTEC) 10 MG tablet Take 10 mg by mouth as needed for allergies.   ferrous sulfate 325 (65 FE) MG tablet Take 1 tablet (325 mg total) by mouth daily with breakfast.   gabapentin (NEURONTIN) 100 MG capsule Take 100 mg by mouth at bedtime.   hydroxychloroquine (PLAQUENIL) 200 MG tablet Take 200 mg by mouth daily.    Levothyroxine Sodium 100 MCG CAPS Take 100 mcg by mouth daily before breakfast.   losartan (COZAAR) 100 MG tablet Take 100 mg by mouth daily.   Multiple Vitamins-Minerals (PRESERVISION AREDS 2 PO) Take by mouth daily at 6 (six) AM.   pantoprazole (PROTONIX) 40 MG tablet Take 40 mg by mouth 2 (two) times daily.   polyethylene glycol (MIRALAX / GLYCOLAX) 17 g packet Take 17 g by mouth daily.   potassium chloride (K-DUR) 10 MEQ tablet Take 10 mEq by mouth daily.   promethazine (PHENERGAN) 12.5 MG tablet Take 1 tablet (12.5 mg total) by mouth every 6 (six) hours as needed for nausea or vomiting.   simvastatin (ZOCOR) 20 MG tablet Take 20 mg by mouth at bedtime.  vitamin B-12 (CYANOCOBALAMIN) 500 MCG tablet Take 1,000 mcg by mouth daily.   vortioxetine HBr (TRINTELLIX) 10 MG TABS tablet Take 10 mg by mouth daily.   dicyclomine (BENTYL) 10 MG capsule Take 1 capsule (10 mg total) by mouth every 12 (twelve) hours as needed for spasms. (Patient not taking: Reported on 01/07/2021)   omeprazole (PRILOSEC) 40 MG capsule Take 1 capsule (40 mg total) by mouth 2 (two) times daily. (Patient not taking: Reported on 05/06/2021)   Plecanatide (TRULANCE) 3 MG TABS Take 3 mg by mouth daily. (Patient not taking: Reported on 05/06/2021)   No facility-administered encounter medications on file as of  05/06/2021.     Objective: Blood pressure (!) 158/68, pulse 93, temperature 98.6 F (37 C), temperature source Oral, height '5\' 1"'  (1.549 m), weight 137 lb 6.4 oz (62.3 kg). Patient is alert and in no acute distress. Conjunctiva is pink. Sclera is nonicteric Oropharyngeal mucosa is normal. No neck masses or thyromegaly noted. Cardiac exam with regular rhythm normal S1 and S2. No murmur or gallop noted. Lungs are clear to auscultation. Abdomen abdomen is symmetrical she has no midline scar.  On palpation abdomen is soft.  She has mild tenderness in LLQ.  No guarding.  No organomegaly or masses. No LE edema or clubbing noted.  Labs/studies Results:   CBC Latest Ref Rng & Units 01/07/2021 11/05/2020 12/17/2017  WBC 3.8 - 10.8 Thousand/uL 8.6 9.3 8.9  Hemoglobin 11.7 - 15.5 g/dL 14.4 13.9 14.5  Hematocrit 35.0 - 45.0 % 43.8 43.4 44.4  Platelets 140 - 400 Thousand/uL 310 300 308    CMP Latest Ref Rng & Units 03/25/2020 02/16/2020 12/17/2017  Glucose 70 - 99 mg/dL - - 97  BUN 8 - 23 mg/dL - - 12  Creatinine 0.44 - 1.00 mg/dL 0.80 0.90 0.64  Sodium 135 - 145 mmol/L - - 139  Potassium 3.5 - 5.1 mmol/L - - 3.5  Chloride 98 - 111 mmol/L - - 100  CO2 22 - 32 mmol/L - - 31  Calcium 8.9 - 10.3 mg/dL - - 8.1(L)  Total Protein 6.5 - 8.1 g/dL - - 7.1  Total Bilirubin 0.3 - 1.2 mg/dL - - 1.0  Alkaline Phos 38 - 126 U/L - - 58  AST 15 - 41 U/L - - 17  ALT 0 - 44 U/L - - 13    Hepatic Function Latest Ref Rng & Units 12/17/2017  Total Protein 6.5 - 8.1 g/dL 7.1  Albumin 3.5 - 5.0 g/dL 3.9  AST 15 - 41 U/L 17  ALT 0 - 44 U/L 13  Alk Phosphatase 38 - 126 U/L 58  Total Bilirubin 0.3 - 1.2 mg/dL 1.0     Assessment:  #1.  Chronic GERD.  She has at least a moderate sized sliding hiatal hernia.  Last EGD was 10 months ago.  She has been on omeprazole in the past and now on pantoprazole which is not working.  We will switch her to esomeprazole and hopefully it would. She is watching her diet.  #2.   Esophageal dysphagia.  She had her esophagus dilated back in June 2016 and did respond.  No structural abnormality noted on EGD of March 2022 by Dr. Jenetta Downer.  We will proceed with barium study before considering esophageal dilation.  #3.  History of iron deficiency anemia.  H&H was normal in September 2022.  Work-up negative including duodenal biopsies for celiac disease.  No history of GI bleed.  Suspect iron deficiency anemia secondary  to impaired iron absorption in the setting of chronic acid suppression.  She should be back on iron once constipation improves.  #4.  Chronic constipation/IBS.  She did not try Motegrity.  She has been on polyethylene glycol as well as Linzess in the past.  We will try her on plecanatide.   Plan:  Medication list updated. Barium pill esophagogram. Discontinue pantoprazole and begin esomeprazole 40 mg by mouth 30 minutes before breakfast daily. Trulance/plecanatide 3 mg by mouth every morning.  3-week supply of samples provided to patient.  If it works we will call prescription. Office visit in 3 months

## 2021-05-07 ENCOUNTER — Ambulatory Visit (HOSPITAL_COMMUNITY)
Admission: RE | Admit: 2021-05-07 | Discharge: 2021-05-07 | Disposition: A | Discharge status: Home / Self Care | Payer: Medicare Other | Source: Ambulatory Visit | Attending: Internal Medicine | Admitting: Internal Medicine

## 2021-05-07 DIAGNOSIS — R1319 Other dysphagia: Secondary | ICD-10-CM | POA: Insufficient documentation

## 2021-05-07 DIAGNOSIS — K224 Dyskinesia of esophagus: Secondary | ICD-10-CM | POA: Diagnosis not present

## 2021-05-07 DIAGNOSIS — K449 Diaphragmatic hernia without obstruction or gangrene: Secondary | ICD-10-CM | POA: Diagnosis not present

## 2021-05-09 NOTE — Patient Instructions (Signed)
We will contact patient with results of barium study. Patient will call with progress report in few weeks regarding constipation.

## 2021-06-06 DIAGNOSIS — U071 COVID-19: Secondary | ICD-10-CM | POA: Diagnosis not present

## 2021-06-06 DIAGNOSIS — J069 Acute upper respiratory infection, unspecified: Secondary | ICD-10-CM | POA: Diagnosis not present

## 2021-06-06 DIAGNOSIS — I700 Atherosclerosis of aorta: Secondary | ICD-10-CM | POA: Diagnosis not present

## 2021-06-06 DIAGNOSIS — Z299 Encounter for prophylactic measures, unspecified: Secondary | ICD-10-CM | POA: Diagnosis not present

## 2021-06-06 DIAGNOSIS — I10 Essential (primary) hypertension: Secondary | ICD-10-CM | POA: Diagnosis not present

## 2021-06-10 ENCOUNTER — Telehealth (INDEPENDENT_AMBULATORY_CARE_PROVIDER_SITE_OTHER): Payer: Self-pay | Admitting: *Deleted

## 2021-06-10 ENCOUNTER — Other Ambulatory Visit (INDEPENDENT_AMBULATORY_CARE_PROVIDER_SITE_OTHER): Payer: Self-pay | Admitting: Internal Medicine

## 2021-06-10 MED ORDER — TRULANCE 3 MG PO TABS: 3.0000 mg | ORAL_TABLET | Freq: Every morning | ORAL | 5 refills | Status: DC

## 2021-06-10 NOTE — Telephone Encounter (Signed)
Pt had samples of trulance given and she states it did work well for her and would like rx sent [facility] and she will get if cost is not too much.  Also pantoprazole was changed to esomeprazole and she states it is working a little better she does not have the pain anymore just still having some gas.   [phone]

## 2021-06-10 NOTE — Progress Notes (Signed)
Patient states Nicole Simon is working.  She was given samples for 21 days.  We will send prescription to her pharmacy for 1 month with 5 refills.  I am hoping co-pay would be reasonable.

## 2021-06-11 NOTE — Telephone Encounter (Signed)
Pt has tried [place]. Took for 3 days last sept and said it made her heave and vomit, states linzess made her go too much and stool watery

## 2021-06-11 NOTE — Telephone Encounter (Signed)
Christian with [facility] called and said copay for trulance is $100 and patient cannot do that. Can a generic be prescribed.

## 2021-06-13 DIAGNOSIS — I10 Essential (primary) hypertension: Secondary | ICD-10-CM | POA: Diagnosis not present

## 2021-06-13 DIAGNOSIS — I700 Atherosclerosis of aorta: Secondary | ICD-10-CM | POA: Diagnosis not present

## 2021-06-13 DIAGNOSIS — J069 Acute upper respiratory infection, unspecified: Secondary | ICD-10-CM | POA: Diagnosis not present

## 2021-06-13 DIAGNOSIS — Z299 Encounter for prophylactic measures, unspecified: Secondary | ICD-10-CM | POA: Diagnosis not present

## 2021-06-13 NOTE — Telephone Encounter (Signed)
Per dr Laural Golden- give trulance samples and take one every other day. Take miralax on the off days. I called and pt verbalized understanding. Samples placed at front for pickup.

## 2021-06-20 ENCOUNTER — Encounter (INDEPENDENT_AMBULATORY_CARE_PROVIDER_SITE_OTHER): Payer: Self-pay

## 2021-06-25 NOTE — Telephone Encounter (Signed)
error 

## 2021-07-07 DIAGNOSIS — I10 Essential (primary) hypertension: Secondary | ICD-10-CM | POA: Diagnosis not present

## 2021-07-07 DIAGNOSIS — D692 Other nonthrombocytopenic purpura: Secondary | ICD-10-CM | POA: Diagnosis not present

## 2021-07-07 DIAGNOSIS — Z789 Other specified health status: Secondary | ICD-10-CM | POA: Diagnosis not present

## 2021-07-07 DIAGNOSIS — Z299 Encounter for prophylactic measures, unspecified: Secondary | ICD-10-CM | POA: Diagnosis not present

## 2021-08-06 DIAGNOSIS — Z299 Encounter for prophylactic measures, unspecified: Secondary | ICD-10-CM | POA: Diagnosis not present

## 2021-08-06 DIAGNOSIS — Z7189 Other specified counseling: Secondary | ICD-10-CM | POA: Diagnosis not present

## 2021-08-06 DIAGNOSIS — I10 Essential (primary) hypertension: Secondary | ICD-10-CM | POA: Diagnosis not present

## 2021-08-06 DIAGNOSIS — Z0000 Encounter for general adult medical examination without abnormal findings: Secondary | ICD-10-CM | POA: Diagnosis not present

## 2021-08-12 DIAGNOSIS — M5430 Sciatica, unspecified side: Secondary | ICD-10-CM | POA: Diagnosis not present

## 2021-08-12 DIAGNOSIS — I700 Atherosclerosis of aorta: Secondary | ICD-10-CM | POA: Diagnosis not present

## 2021-08-12 DIAGNOSIS — M549 Dorsalgia, unspecified: Secondary | ICD-10-CM | POA: Diagnosis not present

## 2021-08-12 DIAGNOSIS — I10 Essential (primary) hypertension: Secondary | ICD-10-CM | POA: Diagnosis not present

## 2021-08-12 DIAGNOSIS — Z299 Encounter for prophylactic measures, unspecified: Secondary | ICD-10-CM | POA: Diagnosis not present

## 2021-08-19 DIAGNOSIS — Z6823 Body mass index (BMI) 23.0-23.9, adult: Secondary | ICD-10-CM | POA: Diagnosis not present

## 2021-08-19 DIAGNOSIS — Z79899 Other long term (current) drug therapy: Secondary | ICD-10-CM | POA: Diagnosis not present

## 2021-08-19 DIAGNOSIS — M1991 Primary osteoarthritis, unspecified site: Secondary | ICD-10-CM | POA: Diagnosis not present

## 2021-08-19 DIAGNOSIS — M154 Erosive (osteo)arthritis: Secondary | ICD-10-CM | POA: Diagnosis not present

## 2021-08-19 DIAGNOSIS — M545 Low back pain, unspecified: Secondary | ICD-10-CM | POA: Diagnosis not present

## 2021-09-15 DIAGNOSIS — Z0000 Encounter for general adult medical examination without abnormal findings: Secondary | ICD-10-CM | POA: Diagnosis not present

## 2021-09-15 DIAGNOSIS — Z79899 Other long term (current) drug therapy: Secondary | ICD-10-CM | POA: Diagnosis not present

## 2021-09-15 DIAGNOSIS — E039 Hypothyroidism, unspecified: Secondary | ICD-10-CM | POA: Diagnosis not present

## 2021-09-15 DIAGNOSIS — E7800 Pure hypercholesterolemia, unspecified: Secondary | ICD-10-CM | POA: Diagnosis not present

## 2021-09-15 DIAGNOSIS — Z299 Encounter for prophylactic measures, unspecified: Secondary | ICD-10-CM | POA: Diagnosis not present

## 2021-09-15 DIAGNOSIS — I10 Essential (primary) hypertension: Secondary | ICD-10-CM | POA: Diagnosis not present

## 2021-09-15 DIAGNOSIS — R5383 Other fatigue: Secondary | ICD-10-CM | POA: Diagnosis not present

## 2021-09-19 DIAGNOSIS — E039 Hypothyroidism, unspecified: Secondary | ICD-10-CM | POA: Diagnosis not present

## 2021-09-19 DIAGNOSIS — H5789 Other specified disorders of eye and adnexa: Secondary | ICD-10-CM | POA: Diagnosis not present

## 2021-09-19 DIAGNOSIS — Z299 Encounter for prophylactic measures, unspecified: Secondary | ICD-10-CM | POA: Diagnosis not present

## 2021-09-19 DIAGNOSIS — Z789 Other specified health status: Secondary | ICD-10-CM | POA: Diagnosis not present

## 2021-09-19 DIAGNOSIS — I10 Essential (primary) hypertension: Secondary | ICD-10-CM | POA: Diagnosis not present

## 2021-10-07 ENCOUNTER — Ambulatory Visit (INDEPENDENT_AMBULATORY_CARE_PROVIDER_SITE_OTHER): Payer: Medicare Other | Admitting: Internal Medicine

## 2021-10-07 ENCOUNTER — Encounter (INDEPENDENT_AMBULATORY_CARE_PROVIDER_SITE_OTHER): Payer: Self-pay | Admitting: Internal Medicine

## 2021-10-07 VITALS — BP 150/76 | HR 90 | Temp 99.7°F | Ht 61.0 in | Wt 131.2 lb

## 2021-10-07 DIAGNOSIS — D509 Iron deficiency anemia, unspecified: Secondary | ICD-10-CM | POA: Diagnosis not present

## 2021-10-07 DIAGNOSIS — K219 Gastro-esophageal reflux disease without esophagitis: Secondary | ICD-10-CM | POA: Diagnosis not present

## 2021-10-07 DIAGNOSIS — E039 Hypothyroidism, unspecified: Secondary | ICD-10-CM | POA: Diagnosis not present

## 2021-10-07 DIAGNOSIS — R634 Abnormal weight loss: Secondary | ICD-10-CM

## 2021-10-07 DIAGNOSIS — K5900 Constipation, unspecified: Secondary | ICD-10-CM

## 2021-10-07 DIAGNOSIS — I10 Essential (primary) hypertension: Secondary | ICD-10-CM | POA: Diagnosis not present

## 2021-10-07 DIAGNOSIS — J309 Allergic rhinitis, unspecified: Secondary | ICD-10-CM | POA: Diagnosis not present

## 2021-10-07 DIAGNOSIS — Z299 Encounter for prophylactic measures, unspecified: Secondary | ICD-10-CM | POA: Diagnosis not present

## 2021-10-07 MED ORDER — TRULANCE 3 MG PO TABS: 3.0000 mg | ORAL_TABLET | Freq: Every morning | ORAL | 5 refills | Status: AC

## 2021-10-07 MED ORDER — FAMOTIDINE 20 MG PO TABS
20.0000 mg | ORAL_TABLET | Freq: Every day | ORAL | 3 refills | Status: AC
Start: 2021-10-07 — End: ?

## 2021-10-07 NOTE — Patient Instructions (Signed)
Will request recent blood work for review.

## 2021-10-07 NOTE — Progress Notes (Signed)
Presenting complaint;  Follow-up for chronic GERD and constipation. History of iron deficiency anemia.  Database and subjective:  Patient is 88 year old Caucasian female who is here for scheduled visit.  She was last seen on 05/06/2021. She has chronic GERD history of iron deficiency anemia for which she underwent EGD and colonoscopy in March 2022.  Iron deficiency anemia corrected with p.o. iron.  Iron deficiency anemia most likely secondary to impaired iron absorption in setting of chronic PPI therapy.  She also has history of constipation and has been on Trulance/plecanatide.  Patient states she is not doing well.  She has daily heartburn and uses Rolaids every day.  She remains with dysphagia which is primarily to meat which she does not eat often.  She feels present therapy is not working. She states she is doing better as far as constipation is concerned.  She has 1-2 stools daily.  Most of her stools are small volume but once a week or more often she has what she would describe to be good evacuation.  At times she feels stool in the rectum and she is just not able to push it out.  Stools are dark but she is on p.o. iron.  She denies rectal bleeding.  Her appetite is not good.  She has lost 6 pounds since her last visit. She she is on gabapentin for peripheral neuropathy.  She has pain and burning both lower extremities and feet. She feels her anxiety and depression is not well controlled.  She is willing to see a psychiatrist. Patient lives alone.  She does prepare her meals herself. She has 2 sisters live close by but they are elderly and they have their own health problems.  She therefore does not see them often. She is closed with 2 of her grandchildren but they live [place] of state. She complains of bloating.  Current Medications: Outpatient Encounter Medications as of 10/07/2021  Medication Sig   amLODipine (NORVASC) 5 MG tablet Take 5 mg by mouth daily.   aspirin EC 81 MG tablet Take 81  mg by mouth daily.   cetirizine (ZYRTEC) 10 MG tablet Take 10 mg by mouth as needed for allergies.   esomeprazole (NEXIUM) 40 MG capsule Take 1 capsule (40 mg total) by mouth daily before breakfast.   ferrous sulfate 325 (65 FE) MG tablet Take 1 tablet (325 mg total) by mouth daily with breakfast.   gabapentin (NEURONTIN) 100 MG capsule Take 100 mg by mouth at bedtime.   hydroxychloroquine (PLAQUENIL) 200 MG tablet Take 200 mg by mouth daily.    levothyroxine (SYNTHROID) 150 MCG tablet Take 150 mcg by mouth daily before breakfast.   losartan (COZAAR) 100 MG tablet Take 100 mg by mouth daily.   Multiple Vitamins-Minerals (PRESERVISION AREDS 2 PO) Take by mouth daily at 6 (six) AM.   Plecanatide (TRULANCE) 3 MG TABS Take 3 mg by mouth in the morning.   polyethylene glycol (MIRALAX / GLYCOLAX) 17 g packet Take 17 g by mouth daily. One capful every other day   potassium chloride (K-DUR) 10 MEQ tablet Take 10 mEq by mouth daily.   simvastatin (ZOCOR) 20 MG tablet Take 20 mg by mouth at bedtime.   vitamin B-12 (CYANOCOBALAMIN) 500 MCG tablet Take 1,000 mcg by mouth daily.   vortioxetine HBr (TRINTELLIX) 10 MG TABS tablet Take 10 mg by mouth daily.   No facility-administered encounter medications on file as of 10/07/2021.     Objective: Blood pressure (!) 150/76, pulse 90, temperature  99.7 F (37.6 C), temperature source Oral, height '5\' 1"'  (1.549 m), weight 131 lb 3.2 oz (59.5 kg). Patient is alert and in no acute distress. Conjunctiva is pink. Sclera is nonicteric Oropharyngeal mucosa is normal. No neck masses or thyromegaly noted. Cardiac exam with regular rhythm normal S1 and S2. No murmur or gallop noted. Lungs are clear to auscultation. Abdomen is symmetrical and soft.  She has mild midepigastric tenderness.  No organomegaly or masses. No LE edema or clubbing noted.  Labs/studies Results:      Latest Ref Rng & Units 01/07/2021   11:20 AM 11/05/2020   10:26 AM 12/17/2017   12:50 PM  CBC   WBC 3.8 - 10.8 Thousand/uL 8.6   9.3   8.9    Hemoglobin 11.7 - 15.5 g/dL 14.4   13.9   14.5    Hematocrit 35.0 - 45.0 % 43.8   43.4   44.4    Platelets 140 - 400 Thousand/uL 310   300   308         Latest Ref Rng & Units 03/25/2020    9:06 AM 02/16/2020    5:07 PM 12/17/2017   12:50 PM  CMP  Glucose 70 - 99 mg/dL   97    BUN 8 - 23 mg/dL   12    Creatinine 0.44 - 1.00 mg/dL 0.80   0.90   0.64    Sodium 135 - 145 mmol/L   139    Potassium 3.5 - 5.1 mmol/L   3.5    Chloride 98 - 111 mmol/L   100    CO2 22 - 32 mmol/L   31    Calcium 8.9 - 10.3 mg/dL   8.1    Total Protein 6.5 - 8.1 g/dL   7.1    Total Bilirubin 0.3 - 1.2 mg/dL   1.0    Alkaline Phos 38 - 126 U/L   58    AST 15 - 41 U/L   17    ALT 0 - 44 U/L   13         Latest Ref Rng & Units 12/17/2017   12:50 PM  Hepatic Function  Total Protein 6.5 - 8.1 g/dL 7.1    Albumin 3.5 - 5.0 g/dL 3.9    AST 15 - 41 U/L 17    ALT 0 - 44 U/L 13    Alk Phosphatase 38 - 126 U/L 58    Total Bilirubin 0.3 - 1.2 mg/dL 1.0      Above lab data reviewed.  Assessment:  #1.  Chronic GERD.  She has moderate size sliding hiatal hernia.  She is having daily heartburn she was switched from pantoprazole to esomeprazole on her last visit.  She may benefit from addition of famotidine at bedtime.  #2.  Chronic constipation.  She is doing well with combination of plecanatide and polyethylene glycol.  #3.  History of iron deficiency anemia.  Hemoglobin in September 2020 was normal as above.  Will request copy of recent blood work from Dr. Marcial Pacas office.  #4.  History of anxiety and depression.  Symptoms poorly controlled with current therapy.  Patient agreeable to see a psychiatrist.   Plan:  Take famotidine 20 mg by mouth every evening or at bedtime. Take esomeprazole 40 mg by mouth 30 minutes before breakfast daily. Will request recent blood work for review. Communicated with Dr. Woody Seller who will arrange for psychiatric  consultation. Trulance samples given to the patient.  [place]  doses. Office visit in 6 months.

## 2021-10-21 DIAGNOSIS — Z8639 Personal history of other endocrine, nutritional and metabolic disease: Secondary | ICD-10-CM | POA: Diagnosis not present

## 2021-10-23 DIAGNOSIS — I10 Essential (primary) hypertension: Secondary | ICD-10-CM | POA: Diagnosis not present

## 2021-10-23 DIAGNOSIS — E039 Hypothyroidism, unspecified: Secondary | ICD-10-CM | POA: Diagnosis not present

## 2021-10-23 DIAGNOSIS — Z299 Encounter for prophylactic measures, unspecified: Secondary | ICD-10-CM | POA: Diagnosis not present

## 2021-10-23 DIAGNOSIS — I700 Atherosclerosis of aorta: Secondary | ICD-10-CM | POA: Diagnosis not present

## 2021-10-23 DIAGNOSIS — J22 Unspecified acute lower respiratory infection: Secondary | ICD-10-CM | POA: Diagnosis not present

## 2021-11-21 DIAGNOSIS — I10 Essential (primary) hypertension: Secondary | ICD-10-CM | POA: Diagnosis not present

## 2021-11-21 DIAGNOSIS — Z299 Encounter for prophylactic measures, unspecified: Secondary | ICD-10-CM | POA: Diagnosis not present

## 2021-11-21 DIAGNOSIS — E039 Hypothyroidism, unspecified: Secondary | ICD-10-CM | POA: Diagnosis not present

## 2021-11-21 DIAGNOSIS — Z789 Other specified health status: Secondary | ICD-10-CM | POA: Diagnosis not present

## 2021-11-22 LAB — TSH: TSH: 0.38 — AB (ref 0.41–5.90)

## 2021-12-25 ENCOUNTER — Other Ambulatory Visit (INDEPENDENT_AMBULATORY_CARE_PROVIDER_SITE_OTHER): Payer: Self-pay | Admitting: Internal Medicine

## 2021-12-25 NOTE — Telephone Encounter (Signed)
Last seen by Dr. Laural Golden 10/07/21. Recommended follow up in December.

## 2022-01-02 DIAGNOSIS — Z299 Encounter for prophylactic measures, unspecified: Secondary | ICD-10-CM | POA: Diagnosis not present

## 2022-01-02 DIAGNOSIS — I10 Essential (primary) hypertension: Secondary | ICD-10-CM | POA: Diagnosis not present

## 2022-01-02 DIAGNOSIS — R5383 Other fatigue: Secondary | ICD-10-CM | POA: Diagnosis not present

## 2022-01-02 DIAGNOSIS — E039 Hypothyroidism, unspecified: Secondary | ICD-10-CM | POA: Diagnosis not present

## 2022-01-03 LAB — TSH: TSH: 0.03 — AB (ref 0.41–5.90)

## 2022-01-19 DIAGNOSIS — Z299 Encounter for prophylactic measures, unspecified: Secondary | ICD-10-CM | POA: Diagnosis not present

## 2022-01-19 DIAGNOSIS — I700 Atherosclerosis of aorta: Secondary | ICD-10-CM | POA: Diagnosis not present

## 2022-01-19 DIAGNOSIS — J069 Acute upper respiratory infection, unspecified: Secondary | ICD-10-CM | POA: Diagnosis not present

## 2022-01-19 DIAGNOSIS — I10 Essential (primary) hypertension: Secondary | ICD-10-CM | POA: Diagnosis not present

## 2022-02-02 DIAGNOSIS — Z79899 Other long term (current) drug therapy: Secondary | ICD-10-CM | POA: Diagnosis not present

## 2022-02-02 DIAGNOSIS — H524 Presbyopia: Secondary | ICD-10-CM | POA: Diagnosis not present

## 2022-02-04 ENCOUNTER — Other Ambulatory Visit: Payer: Self-pay | Admitting: Internal Medicine

## 2022-02-05 ENCOUNTER — Other Ambulatory Visit: Payer: Self-pay | Admitting: Internal Medicine

## 2022-02-05 DIAGNOSIS — Z1231 Encounter for screening mammogram for malignant neoplasm of breast: Secondary | ICD-10-CM

## 2022-02-06 ENCOUNTER — Ambulatory Visit: Payer: Medicare Other | Admitting: Nurse Practitioner

## 2022-02-06 ENCOUNTER — Encounter: Payer: Self-pay | Admitting: Nurse Practitioner

## 2022-02-06 VITALS — BP 162/79 | HR 83 | Ht 61.0 in | Wt 129.8 lb

## 2022-02-06 DIAGNOSIS — E890 Postprocedural hypothyroidism: Secondary | ICD-10-CM

## 2022-02-06 DIAGNOSIS — E039 Hypothyroidism, unspecified: Secondary | ICD-10-CM | POA: Diagnosis not present

## 2022-02-06 NOTE — Progress Notes (Signed)
Endocrinology Consult Note                                         02/06/2022, 10:12 AM  Subjective:   Subjective    Nicole Simon is a 88 y.o.-year-old female patient being seen in consultation for hypothyroidism referred by Glenda Chroman, MD.   Past Medical History:  Diagnosis Date   Arthritis    Depression    GERD (gastroesophageal reflux disease)    Hypertension    Hypothyroidism    Peripheral neuropathy     Past Surgical History:  Procedure Laterality Date   ABDOMINAL HYSTERECTOMY     APPENDECTOMY     BACK SURGERY     BIOPSY  07/23/2020   Procedure: BIOPSY;  Surgeon: Harvel Quale, MD;  [facility]: [facility];  Service: Gastroenterology;;   COLONOSCOPY     COLONOSCOPY  02/17/2012   Procedure: COLONOSCOPY;  Surgeon: Rogene Houston, MD;  Location: [facility];  Service: Endoscopy;  Laterality: N/A;  1030   COLONOSCOPY N/A 10/05/2014   Procedure: COLONOSCOPY;  Surgeon: Rogene Houston, MD;  Location: [facility];  Service: Endoscopy;  Laterality: N/A;  855   COLONOSCOPY N/A 12/11/2016   Procedure: COLONOSCOPY;  Surgeon: Rogene Houston, MD;  Location: [facility];  Service: Endoscopy;  Laterality: N/A;  155   COLONOSCOPY WITH PROPOFOL N/A 07/23/2020   Castaneda: examined portion of ileum was normal, diverticulsos in sigmoid colon and descneding, non bleeding internal hemorrhoids, no specimens   DILATION AND CURETTAGE OF UTERUS     ESOPHAGEAL DILATION N/A 10/05/2014   Procedure: ESOPHAGEAL DILATION;  Surgeon: Rogene Houston, MD;  Location: [facility];  Service: Endoscopy;  Laterality: N/A;   ESOPHAGOGASTRODUODENOSCOPY N/A 10/05/2014   Procedure: ESOPHAGOGASTRODUODENOSCOPY (EGD);  Surgeon: Rogene Houston, MD;  [facility]: [facility];  Service: Endoscopy;  Laterality: N/A;   ESOPHAGOGASTRODUODENOSCOPY N/A 12/11/2016   Procedure: ESOPHAGOGASTRODUODENOSCOPY (EGD);  Surgeon: Rogene Houston, MD;  [facility]: [facility];  Service: Endoscopy;  Laterality: N/A;   ESOPHAGOGASTRODUODENOSCOPY (EGD) WITH PROPOFOL N/A 07/23/2020   Castaneda: 5cm hh, multiple gastric polyps, consistent with fundic gland plyps, normal examind duodenum, biopsied (normal)   THYROIDECTOMY      Social History   Socioeconomic History   Marital status: Widowed    Spouse name: Not on file   Number of children: Not on file   Years of education: Not on file   Highest education level: Not on file  Occupational History   Not on file  Tobacco Use   Smoking status: Never    Passive exposure: Never   Smokeless tobacco: Never  Vaping Use   Vaping Use: Never used  Substance and Sexual Activity   Alcohol use: No    Alcohol/week: 0.0 standard drinks of alcohol   Drug use: No   Sexual activity: Not on file  Other Topics Concern   Not on file  Social History Narrative   Not on file  Social Determinants of Health   Financial Resource Strain: Not on file  Food Insecurity: Not on file  Transportation Needs: Not on file  Physical Activity: Not on file  Stress: Not on file  Social Connections: Not on file    Family History  Problem Relation Age of Onset   Colon cancer Neg Hx    Breast cancer Neg Hx     Outpatient Encounter Medications as of 02/06/2022  Medication Sig   amLODipine (NORVASC) 5 MG tablet Take 5 mg by mouth daily.   aspirin EC 81 MG tablet Take 81 mg by mouth daily.   cetirizine (ZYRTEC) 10 MG tablet Take 10 mg by mouth as needed for allergies.   famotidine (PEPCID) 20 MG tablet Take 1 tablet (20 mg total) by mouth at bedtime.   ferrous sulfate 325 (65 FE) MG tablet Take 1 tablet (325 mg total) by mouth daily with breakfast.   gabapentin (NEURONTIN) 100 MG capsule Take 100 mg by mouth at bedtime.   hydroxychloroquine (PLAQUENIL) 200 MG tablet Take 200 mg by mouth daily.    levothyroxine (SYNTHROID) 150 MCG tablet Take 75 mcg by mouth daily before breakfast.   losartan  (COZAAR) 100 MG tablet Take 100 mg by mouth daily.   Multiple Vitamins-Minerals (PRESERVISION AREDS 2 PO) Take by mouth daily at 6 (six) AM.   Plecanatide (TRULANCE) 3 MG TABS Take 3 mg by mouth in the morning. (Patient taking differently: Take 3 mg by mouth in the morning. As needed)   polyethylene glycol (MIRALAX / GLYCOLAX) 17 g packet Take 17 g by mouth daily. One capful every other day   potassium chloride (K-DUR) 10 MEQ tablet Take 10 mEq by mouth daily.   simvastatin (ZOCOR) 20 MG tablet Take 20 mg by mouth at bedtime.   vitamin B-12 (CYANOCOBALAMIN) 500 MCG tablet Take 1,000 mcg by mouth daily.   vortioxetine HBr (TRINTELLIX) 10 MG TABS tablet Take 10 mg by mouth daily.   esomeprazole (NEXIUM) 40 MG capsule TAKE ONE CAPSULE BY MOUTH DAILY BEFORE BREAKFAST (Patient not taking: Reported on 02/06/2022)   No facility-administered encounter medications on file as of 02/06/2022.    ALLERGIES: Allergies  Allergen Reactions   Penicillins Hives and Itching    Hives Has patient had a PCN reaction causing immediate rash, facial/tongue/throat swelling, SOB or lightheadedness with hypotension:Yes Has patient had a PCN reaction causing severe rash involving mucus membranes or skin necrosis:No Has patient had a PCN reaction that required hospitalization:No Has patient had a PCN reaction occurring within the last 10 years:No If all of the above answers are "NO", then may proceed with Cephalosporin use.    VACCINATION STATUS:  There is no immunization history on file for this patient.   HPI   Nicole Simon  is a patient with the above medical history. she was diagnosed with thyroid cancer at approx age of 88 years old which required several surgeries, ending up in total thyroidectomy and subsequent initiation of thyroid hormone replacement therapy. she was given various doses of Levothyroxine over the years (ranging between 75-88 mcg), currently on 75 micrograms. she reports compliance to this  medication:  Taking it daily on empty stomach with water, separated by >30 minutes before breakfast and other medications, and by at least 4 hours from calcium, iron, PPIs, multivitamins .  She does note she does take it close to her cup of coffee in the morning.  She has a granddaughter who is a Marine scientist and told her about separating  her thyroid medication from her others.  I reviewed patient's thyroid tests:  Lab Results  Component Value Date   TSH 0.03 (A) 01/02/2022   TSH 0.38 (A) 11/21/2021   TSH 1.71 07/04/2020     Pt denies feeling nodules in neck, hoarseness, dysphagia/odynophagia, SOB with lying down.  she does not have family history of thyroid disorders.  No family history of thyroid cancer.  She does have personal history of thyroid cancer, had radiation treatment and total thyroidectomy. No recent use of iodine supplements.  Denies use of Biotin containing supplements.  I reviewed her chart and she also has a history of GERD, IBS, anemia, HTN.   ROS:  Constitutional: + weight loss, no fatigue, no subjective hyperthermia, no subjective hypothermia Eyes: no blurry vision, no xerophthalmia ENT: no sore throat, no nodules palpated in throat, no dysphagia/odynophagia, no hoarseness Cardiovascular: no chest pain, no SOB, + palpitations, no leg swelling Respiratory: no cough, no SOB Gastrointestinal: no nausea/vomiting/diarrhea Musculoskeletal: no muscle/joint aches, walks with cane- gets unsteady on feet Skin: no rashes Neurological: no tremors, no numbness, no tingling, no dizziness Psychiatric: no depression, + anxiety   Objective:   Objective     BP (!) 162/79 (BP Location: Left Arm, Patient Position: Sitting, Cuff Size: Large)   Pulse 83   Ht '5\' 1"'$  (1.549 m)   Wt 129 lb 12.8 oz (58.9 kg)   BMI 24.53 kg/m  Wt Readings from Last 3 Encounters:  02/06/22 129 lb 12.8 oz (58.9 kg)  10/07/21 131 lb 3.2 oz (59.5 kg)  05/06/21 137 lb 6.4 oz (62.3 kg)    BP Readings  from Last 3 Encounters:  02/06/22 (!) 162/79  10/07/21 (!) 150/76  05/06/21 (!) 158/68     Constitutional:  Body mass index is 24.53 kg/m., not in acute distress, normal state of mind Eyes: PERRLA, EOMI, no exophthalmos ENT: moist mucous membranes, no thyromegaly, no cervical lymphadenopathy, faint surgical scar from total thyroidectomy visible Cardiovascular: normal precordial activity, RRR, no murmur/rubs/gallops Respiratory:  adequate breathing efforts, no gross chest deformity, Clear to auscultation bilaterally Gastrointestinal: abdomen soft, non-tender, no distension, bowel sounds present Musculoskeletal: no gross deformities, strength intact in all four extremities Skin: moist, warm, no rashes Neurological: mild tremor with outstretched hands, deep tendon reflexes normal in BLE.   CMP ( most recent) CMP     Component Value Date/Time   NA 139 12/17/2017 1250   K 3.5 12/17/2017 1250   CL 100 12/17/2017 1250   CO2 31 12/17/2017 1250   GLUCOSE 97 12/17/2017 1250   BUN 12 12/17/2017 1250   CREATININE 0.80 03/25/2020 0906   CALCIUM 8.1 (L) 12/17/2017 1250   PROT 7.1 12/17/2017 1250   ALBUMIN 3.9 12/17/2017 1250   AST 17 12/17/2017 1250   ALT 13 12/17/2017 1250   ALKPHOS 58 12/17/2017 1250   BILITOT 1.0 12/17/2017 1250   GFRNONAA >60 12/17/2017 1250   GFRAA >60 12/17/2017 1250     Diabetic Labs (most recent): No results found for: "HGBA1C", "MICROALBUR"   Lipid Panel ( most recent) Lipid Panel  No results found for: "CHOL", "TRIG", "HDL", "CHOLHDL", "VLDL", "LDLCALC", "LDLDIRECT", "LABVLDL"     Lab Results  Component Value Date   TSH 0.03 (A) 01/02/2022   TSH 0.38 (A) 11/21/2021   TSH 1.71 07/04/2020      Assessment & Plan:   ASSESSMENT / PLAN:  1. Hypothyroidism-postoperative for hx of thyroid cancer   Patient with long-standing hypothyroidism, on levothyroxine therapy. On physical exam,  patient does not have gross goiter, thyroid nodules, or neck  compression symptoms.  She is advised to continue her Levothyroxine 75 mcg po daily before breakfast, a safe dose based on her body weight.  - We discussed about correct intake of levothyroxine, at fasting, with water, separated by at least 30 minutes from breakfast, and separated by more than 4 hours from calcium, iron, multivitamins, acid reflux medications (PPIs). -Patient is made aware of the fact that thyroid hormone replacement is needed for life, dose to be adjusted by periodic monitoring of thyroid function tests.  - Will check thyroid tests before next visit: TSH, free T4    - Time spent with the patient: 45 minutes, of which >50% was spent in obtaining information about her symptoms, reviewing her previous labs, evaluations, and treatments, counseling her about her hypothyroidism, and developing a plan to confirm the diagnosis and long term treatment as necessary. Please refer to "Patient Self Inventory" in the Media tab for reviewed elements of pertinent patient history.  Jomarie Longs participated in the discussions, expressed understanding, and voiced agreement with the above plans.  All questions were answered to her satisfaction. she is encouraged to contact clinic should she have any questions or concerns prior to her return visit.   FOLLOW UP PLAN:  Return in about 1 week (around 02/13/2022) for Thyroid follow up, Previsit labs.  Rayetta Pigg, [facility] [address] [place], [place] [address] Phone: [phone] Fax: [phone]  02/06/2022, 10:12 AM

## 2022-02-06 NOTE — Patient Instructions (Signed)

## 2022-02-07 LAB — TSH: TSH: 0.506 u[IU]/mL (ref 0.450–4.500)

## 2022-02-07 LAB — T4, FREE: Free T4: 1.4 ng/dL (ref 0.82–1.77)

## 2022-02-12 NOTE — Patient Instructions (Signed)

## 2022-02-13 ENCOUNTER — Ambulatory Visit: Payer: Medicare Other | Admitting: Nurse Practitioner

## 2022-02-13 ENCOUNTER — Encounter: Payer: Self-pay | Admitting: Nurse Practitioner

## 2022-02-13 VITALS — BP 155/70 | HR 73 | Ht 61.0 in | Wt 130.6 lb

## 2022-02-13 DIAGNOSIS — E890 Postprocedural hypothyroidism: Secondary | ICD-10-CM | POA: Diagnosis not present

## 2022-02-13 NOTE — Progress Notes (Signed)
Endocrinology Follow Up Note                                         02/13/2022, 10:38 AM  Subjective:   Subjective    Lisa Milian is a 88 y.o.-year-old female patient being seen in follow up after being seen in consultation for hypothyroidism referred by Glenda Chroman, MD.   Past Medical History:  Diagnosis Date   Arthritis    Depression    GERD (gastroesophageal reflux disease)    Hypertension    Hypothyroidism    Peripheral neuropathy     Past Surgical History:  Procedure Laterality Date   ABDOMINAL HYSTERECTOMY     APPENDECTOMY     BACK SURGERY     BIOPSY  07/23/2020   Procedure: BIOPSY;  Surgeon: Harvel Quale, MD;  [facility];  Service: Gastroenterology;;   COLONOSCOPY     COLONOSCOPY  02/17/2012   Procedure: COLONOSCOPY;  Surgeon: Rogene Houston, MD;  [facility];  Service: Endoscopy;  Laterality: N/A;  1030   COLONOSCOPY N/A 10/05/2014   Procedure: COLONOSCOPY;  Surgeon: Rogene Houston, MD;  [facility];  Service: Endoscopy;  Laterality: N/A;  855   COLONOSCOPY N/A 12/11/2016   Procedure: COLONOSCOPY;  Surgeon: Rogene Houston, MD;  [facility];  Service: Endoscopy;  Laterality: N/A;  155   COLONOSCOPY WITH PROPOFOL N/A 07/23/2020   Castaneda: examined portion of ileum was normal, diverticulsos in sigmoid colon and descneding, non bleeding internal hemorrhoids, no specimens   DILATION AND CURETTAGE OF UTERUS     ESOPHAGEAL DILATION N/A 10/05/2014   Procedure: ESOPHAGEAL DILATION;  Surgeon: Rogene Houston, MD;  [facility];  Service: Endoscopy;  Laterality: N/A;   ESOPHAGOGASTRODUODENOSCOPY N/A 10/05/2014   Procedure: ESOPHAGOGASTRODUODENOSCOPY (EGD);  Surgeon: Rogene Houston, MD;  [facility];  Service: Endoscopy;  Laterality: N/A;   ESOPHAGOGASTRODUODENOSCOPY N/A 12/11/2016   Procedure:  ESOPHAGOGASTRODUODENOSCOPY (EGD);  Surgeon: Rogene Houston, MD;  [facility];  Service: Endoscopy;  Laterality: N/A;   ESOPHAGOGASTRODUODENOSCOPY (EGD) WITH PROPOFOL N/A 07/23/2020   Castaneda: 5cm hh, multiple gastric polyps, consistent with fundic gland plyps, normal examind duodenum, biopsied (normal)   THYROIDECTOMY      Social History   Socioeconomic History   Marital status: Widowed    Spouse name: Not on file   Number of children: Not on file   Years of education: Not on file   Highest education level: Not on file  Occupational History   Not on file  Tobacco Use   Smoking status: Never    Passive exposure: Never   Smokeless tobacco: Never  Vaping Use   Vaping Use: Never used  Substance and Sexual Activity   Alcohol use: No    Alcohol/week: 0.0 standard drinks of alcohol   Drug use: No   Sexual activity: Not on file  Other Topics Concern   Not on file  Social History Narrative  Not on file   Social Determinants of Health   Financial Resource Strain: Not on file  Food Insecurity: Not on file  Transportation Needs: Not on file  Physical Activity: Not on file  Stress: Not on file  Social Connections: Not on file    Family History  Problem Relation Age of Onset   Colon cancer Neg Hx    Breast cancer Neg Hx     Outpatient Encounter Medications as of 02/13/2022  Medication Sig   amLODipine (NORVASC) 5 MG tablet Take 5 mg by mouth daily.   aspirin EC 81 MG tablet Take 81 mg by mouth daily.   cetirizine (ZYRTEC) 10 MG tablet Take 10 mg by mouth as needed for allergies.   esomeprazole (NEXIUM) 40 MG capsule TAKE ONE CAPSULE BY MOUTH DAILY BEFORE BREAKFAST   ferrous sulfate 325 (65 FE) MG tablet Take 1 tablet (325 mg total) by mouth daily with breakfast.   gabapentin (NEURONTIN) 100 MG capsule Take 100 mg by mouth at bedtime.   hydroxychloroquine (PLAQUENIL) 200 MG tablet Take 200 mg by mouth daily.    levothyroxine (SYNTHROID) 150 MCG tablet Take  75 mcg by mouth daily before breakfast.   losartan (COZAAR) 100 MG tablet Take 100 mg by mouth daily.   Multiple Vitamins-Minerals (PRESERVISION AREDS 2 PO) Take by mouth daily at 6 (six) AM.   Plecanatide (TRULANCE) 3 MG TABS Take 3 mg by mouth in the morning. (Patient taking differently: Take 3 mg by mouth in the morning. As needed)   polyethylene glycol (MIRALAX / GLYCOLAX) 17 g packet Take 17 g by mouth daily. One capful every other day   potassium chloride (K-DUR) 10 MEQ tablet Take 10 mEq by mouth daily.   simvastatin (ZOCOR) 20 MG tablet Take 20 mg by mouth at bedtime.   vitamin B-12 (CYANOCOBALAMIN) 500 MCG tablet Take 1,000 mcg by mouth daily.   vortioxetine HBr (TRINTELLIX) 10 MG TABS tablet Take 10 mg by mouth daily.   famotidine (PEPCID) 20 MG tablet Take 1 tablet (20 mg total) by mouth at bedtime. (Patient not taking: Reported on 02/13/2022)   No facility-administered encounter medications on file as of 02/13/2022.    ALLERGIES: Allergies  Allergen Reactions   Penicillins Hives and Itching    Hives Has patient had a PCN reaction causing immediate rash, facial/tongue/throat swelling, SOB or lightheadedness with hypotension:Yes Has patient had a PCN reaction causing severe rash involving mucus membranes or skin necrosis:No Has patient had a PCN reaction that required hospitalization:No Has patient had a PCN reaction occurring within the last 10 years:No If all of the above answers are "NO", then may proceed with Cephalosporin use.    VACCINATION STATUS:  There is no immunization history on file for this patient.   HPI   Jamieka Royle  is a patient with the above medical history. she was diagnosed with thyroid cancer at approx age of 88 years old which required several surgeries, ending up in total thyroidectomy and subsequent initiation of thyroid hormone replacement therapy. she was given various doses of Levothyroxine over the years (ranging between 75-88 mcg), currently  on 75 micrograms. she reports compliance to this medication:  Taking it daily on empty stomach with water, separated by >30 minutes before breakfast and other medications, and by at least 4 hours from calcium, iron, PPIs, multivitamins .  She does note she does take it close to her cup of coffee in the morning.  She has a granddaughter who is a Marine scientist  and told her about separating her thyroid medication from her others.  I reviewed patient's thyroid tests:  Lab Results  Component Value Date   TSH 0.506 02/06/2022   TSH 0.03 (A) 01/02/2022   TSH 0.38 (A) 11/21/2021   TSH 1.71 07/04/2020   FREET4 1.40 02/06/2022     Pt denies feeling nodules in neck, hoarseness, dysphagia/odynophagia, SOB with lying down.  she does not have family history of thyroid disorders.  No family history of thyroid cancer.  She does have personal history of thyroid cancer, had radiation treatment and total thyroidectomy. No recent use of iodine supplements.  Denies use of Biotin containing supplements.  I reviewed her chart and she also has a history of GERD, IBS, anemia, HTN.   ROS:  Constitutional: + weight loss, no fatigue, no subjective hyperthermia, no subjective hypothermia, + runny nose (allergies) Eyes: no blurry vision, no xerophthalmia ENT: no sore throat, no nodules palpated in throat, no dysphagia/odynophagia, no hoarseness Cardiovascular: no chest pain, no SOB, intermittent chest discomfort -radiates to back, no leg swelling Respiratory: no cough, no SOB Gastrointestinal: no nausea/vomiting/diarrhea Musculoskeletal: no muscle/joint aches, walks with cane- gets unsteady on feet Skin: no rashes Neurological: no tremors, no numbness, no tingling, no dizziness Psychiatric: no depression, + anxiety   Objective:   Objective     BP (!) 155/70 (BP Location: Right Arm, Patient Position: Sitting, Cuff Size: Normal) Comment: Patient has not taken BP meds this morning  Pulse 73   Ht '5\' 1"'$  (1.549 m)    Wt 130 lb 9.6 oz (59.2 kg)   BMI 24.68 kg/m  Wt Readings from Last 3 Encounters:  02/13/22 130 lb 9.6 oz (59.2 kg)  02/06/22 129 lb 12.8 oz (58.9 kg)  10/07/21 131 lb 3.2 oz (59.5 kg)    BP Readings from Last 3 Encounters:  02/13/22 (!) 155/70  02/06/22 (!) 162/79  10/07/21 (!) 150/76     Constitutional:  Body mass index is 24.68 kg/m., not in acute distress, normal state of mind Eyes: PERRLA, EOMI, no exophthalmos ENT: moist mucous membranes, no thyromegaly, no cervical lymphadenopathy, faint surgical scar from total thyroidectomy visible Cardiovascular: normal precordial activity, RRR, no murmur/rubs/gallops Respiratory:  adequate breathing efforts, no gross chest deformity, Clear to auscultation bilaterally Gastrointestinal: abdomen soft, non-tender, no distension, bowel sounds present Musculoskeletal: no gross deformities, strength intact in all four extremities Skin: moist, warm, no rashes Neurological: mild tremor with outstretched hands, deep tendon reflexes normal in BLE.   CMP ( most recent) CMP     Component Value Date/Time   NA 139 12/17/2017 1250   K 3.5 12/17/2017 1250   CL 100 12/17/2017 1250   CO2 31 12/17/2017 1250   GLUCOSE 97 12/17/2017 1250   BUN 12 12/17/2017 1250   CREATININE 0.80 03/25/2020 0906   CALCIUM 8.1 (L) 12/17/2017 1250   PROT 7.1 12/17/2017 1250   ALBUMIN 3.9 12/17/2017 1250   AST 17 12/17/2017 1250   ALT 13 12/17/2017 1250   ALKPHOS 58 12/17/2017 1250   BILITOT 1.0 12/17/2017 1250   GFRNONAA >60 12/17/2017 1250   GFRAA >60 12/17/2017 1250     Diabetic Labs (most recent): No results found for: "HGBA1C", "MICROALBUR"   Lipid Panel ( most recent) Lipid Panel  No results found for: "CHOL", "TRIG", "HDL", "CHOLHDL", "VLDL", "LDLCALC", "LDLDIRECT", "LABVLDL"     Lab Results  Component Value Date   TSH 0.506 02/06/2022   TSH 0.03 (A) 01/02/2022   TSH 0.38 (A) 11/21/2021  TSH 1.71 07/04/2020   FREET4 1.40 02/06/2022      Latest Reference Range & Units 07/04/20 10:50 11/21/21 00:00 01/02/22 00:00 02/06/22 10:34  TSH 0.450 - 4.500 uIU/mL 1.71 0.38 ! (E) 0.03 ! (E) 0.506  T4,Free(Direct) 0.82 - 1.77 ng/dL    1.40  !: Data is abnormal (E): External lab result  Assessment & Plan:   ASSESSMENT / PLAN:  1. Hypothyroidism-postoperative for hx of thyroid cancer   Patient with long-standing hypothyroidism, on levothyroxine therapy. On physical exam, patient does not have gross goiter, thyroid nodules, or neck compression symptoms.  Her previsit thyroid function tests are consistent with appropriate hormone replacement.  She is advised to continue her Levothyroxine 75 mcg po daily before breakfast, a safe dose based on her body weight.  - We discussed about correct intake of levothyroxine, at fasting, with water, separated by at least 30 minutes from breakfast, and separated by more than 4 hours from calcium, iron, multivitamins, acid reflux medications (PPIs). -Patient is made aware of the fact that thyroid hormone replacement is needed for life, dose to be adjusted by periodic monitoring of thyroid function tests.  -We discussed her intermittent chest discomfort today and it doesn't appear to be lung or cardiac related, most likely muscular or related to her chronic anxiety but if it continues, she will need to follow up with her PCP for further evaluation.    I spent 20 minutes in the care of the patient today including review of labs from Thyroid Function, CMP, and other relevant labs ; imaging/biopsy records (current and previous including abstractions from other facilities); face-to-face time discussing  her lab results and symptoms, medications doses, her options of short and long term treatment based on the latest standards of care / guidelines;   and documenting the encounter.  Jomarie Longs  participated in the discussions, expressed understanding, and voiced agreement with the above plans.  All questions were  answered to her satisfaction. she is encouraged to contact clinic should she have any questions or concerns prior to her return visit.   FOLLOW UP PLAN:  Return in about 3 months (around 05/16/2022) for Thyroid follow up, Previsit labs.  Rayetta Pigg, [facility] [address] [place], [place] [address] Phone: [phone] Fax: [phone]  02/13/2022, 10:38 AM

## 2022-02-18 DIAGNOSIS — Z79899 Other long term (current) drug therapy: Secondary | ICD-10-CM | POA: Diagnosis not present

## 2022-02-18 DIAGNOSIS — M154 Erosive (osteo)arthritis: Secondary | ICD-10-CM | POA: Diagnosis not present

## 2022-02-18 DIAGNOSIS — M545 Low back pain, unspecified: Secondary | ICD-10-CM | POA: Diagnosis not present

## 2022-02-18 DIAGNOSIS — M1991 Primary osteoarthritis, unspecified site: Secondary | ICD-10-CM | POA: Diagnosis not present

## 2022-02-18 DIAGNOSIS — Z6822 Body mass index (BMI) 22.0-22.9, adult: Secondary | ICD-10-CM | POA: Diagnosis not present

## 2022-02-26 ENCOUNTER — Ambulatory Visit
Admission: RE | Admit: 2022-02-26 | Discharge: 2022-02-26 | Disposition: A | Discharge status: Home / Self Care | Payer: Medicare Other | Source: Ambulatory Visit | Attending: Internal Medicine | Admitting: Internal Medicine

## 2022-02-26 DIAGNOSIS — Z1231 Encounter for screening mammogram for malignant neoplasm of breast: Secondary | ICD-10-CM | POA: Diagnosis not present

## 2022-03-31 ENCOUNTER — Other Ambulatory Visit: Payer: Self-pay

## 2022-03-31 MED ORDER — LEVOTHYROXINE SODIUM 75 MCG PO TABS: 75.0000 ug | ORAL_TABLET | Freq: Every day | ORAL | 0 refills | Status: DC

## 2022-04-02 DIAGNOSIS — I10 Essential (primary) hypertension: Secondary | ICD-10-CM | POA: Diagnosis not present

## 2022-06-10 DIAGNOSIS — E890 Postprocedural hypothyroidism: Secondary | ICD-10-CM | POA: Diagnosis not present

## 2022-06-11 LAB — T4, FREE: Free T4: 1.57 ng/dL (ref 0.82–1.77)

## 2022-06-11 LAB — TSH: TSH: 2.78 u[IU]/mL (ref 0.450–4.500)

## 2022-06-12 DIAGNOSIS — I700 Atherosclerosis of aorta: Secondary | ICD-10-CM | POA: Diagnosis not present

## 2022-06-12 DIAGNOSIS — I10 Essential (primary) hypertension: Secondary | ICD-10-CM | POA: Diagnosis not present

## 2022-06-12 DIAGNOSIS — Z299 Encounter for prophylactic measures, unspecified: Secondary | ICD-10-CM | POA: Diagnosis not present

## 2022-06-12 DIAGNOSIS — D692 Other nonthrombocytopenic purpura: Secondary | ICD-10-CM | POA: Diagnosis not present

## 2022-06-15 NOTE — Patient Instructions (Signed)

## 2022-06-16 ENCOUNTER — Ambulatory Visit: Payer: Medicare Other | Admitting: Nurse Practitioner

## 2022-06-16 ENCOUNTER — Encounter: Payer: Self-pay | Admitting: Nurse Practitioner

## 2022-06-16 VITALS — BP 150/80 | HR 90 | Ht 61.0 in | Wt 132.6 lb

## 2022-06-16 DIAGNOSIS — E890 Postprocedural hypothyroidism: Secondary | ICD-10-CM

## 2022-06-16 MED ORDER — LEVOTHYROXINE SODIUM 75 MCG PO TABS: 75.0000 ug | ORAL_TABLET | Freq: Every day | ORAL | 1 refills | Status: DC

## 2022-06-16 NOTE — Progress Notes (Signed)
Endocrinology Follow Up Note                                         06/16/2022, 10:38 AM  Subjective:   Subjective    Nicole Simon is a 89 y.o.-year-old female patient being seen in follow up after being seen in consultation for hypothyroidism referred by Glenda Chroman, MD.   Past Medical History:  Diagnosis Date   Arthritis    Depression    GERD (gastroesophageal reflux disease)    Hypertension    Hypothyroidism    Peripheral neuropathy     Past Surgical History:  Procedure Laterality Date   ABDOMINAL HYSTERECTOMY     APPENDECTOMY     BACK SURGERY     BIOPSY  07/23/2020   Procedure: BIOPSY;  Surgeon: Harvel Quale, MD;  [facility];  Service: Gastroenterology;;   COLONOSCOPY     COLONOSCOPY  02/17/2012   Procedure: COLONOSCOPY;  Surgeon: Rogene Houston, MD;  [facility];  Service: Endoscopy;  Laterality: N/A;  1030   COLONOSCOPY N/A 10/05/2014   Procedure: COLONOSCOPY;  Surgeon: Rogene Houston, MD;  [facility];  Service: Endoscopy;  Laterality: N/A;  855   COLONOSCOPY N/A 12/11/2016   Procedure: COLONOSCOPY;  Surgeon: Rogene Houston, MD;  [facility];  Service: Endoscopy;  Laterality: N/A;  155   COLONOSCOPY WITH PROPOFOL N/A 07/23/2020   Castaneda: examined portion of ileum was normal, diverticulsos in sigmoid colon and descneding, non bleeding internal hemorrhoids, no specimens   DILATION AND CURETTAGE OF UTERUS     ESOPHAGEAL DILATION N/A 10/05/2014   Procedure: ESOPHAGEAL DILATION;  Surgeon: Rogene Houston, MD;  [facility];  Service: Endoscopy;  Laterality: N/A;   ESOPHAGOGASTRODUODENOSCOPY N/A 10/05/2014   Procedure: ESOPHAGOGASTRODUODENOSCOPY (EGD);  Surgeon: Rogene Houston, MD;  [facility];  Service: Endoscopy;  Laterality: N/A;   ESOPHAGOGASTRODUODENOSCOPY N/A 12/11/2016   Procedure:  ESOPHAGOGASTRODUODENOSCOPY (EGD);  Surgeon: Rogene Houston, MD;  [facility];  Service: Endoscopy;  Laterality: N/A;   ESOPHAGOGASTRODUODENOSCOPY (EGD) WITH PROPOFOL N/A 07/23/2020   Castaneda: 5cm hh, multiple gastric polyps, consistent with fundic gland plyps, normal examind duodenum, biopsied (normal)   THYROIDECTOMY      Social History   Socioeconomic History   Marital status: Widowed    Spouse name: Not on file   Number of children: Not on file   Years of education: Not on file   Highest education level: Not on file  Occupational History   Not on file  Tobacco Use   Smoking status: Never    Passive exposure: Never   Smokeless tobacco: Never  Vaping Use   Vaping Use: Never used  Substance and Sexual Activity   Alcohol use: No    Alcohol/week: 0.0 standard drinks of alcohol   Drug use: No   Sexual activity: Not on file  Other Topics Concern   Not on file  Social History Narrative  Not on file   Social Determinants of Health   Financial Resource Strain: Not on file  Food Insecurity: Not on file  Transportation Needs: Not on file  Physical Activity: Not on file  Stress: Not on file  Social Connections: Not on file    Family History  Problem Relation Age of Onset   Colon cancer Neg Hx    Breast cancer Neg Hx     Outpatient Encounter Medications as of 06/16/2022  Medication Sig   amLODipine (NORVASC) 5 MG tablet Take 5 mg by mouth daily.   aspirin EC 81 MG tablet Take 81 mg by mouth daily.   cetirizine (ZYRTEC) 10 MG tablet Take 10 mg by mouth as needed for allergies.   esomeprazole (NEXIUM) 40 MG capsule TAKE ONE CAPSULE BY MOUTH DAILY BEFORE BREAKFAST   ferrous sulfate 325 (65 FE) MG tablet Take 1 tablet (325 mg total) by mouth daily with breakfast.   gabapentin (NEURONTIN) 100 MG capsule Take 100 mg by mouth at bedtime.   hydroxychloroquine (PLAQUENIL) 200 MG tablet Take 200 mg by mouth daily.    losartan (COZAAR) 100 MG tablet Take 100 mg by  mouth daily.   Multiple Vitamins-Minerals (PRESERVISION AREDS 2 PO) Take by mouth daily at 6 (six) AM.   Plecanatide (TRULANCE) 3 MG TABS Take 3 mg by mouth in the morning. (Patient taking differently: Take 3 mg by mouth in the morning. As needed)   polyethylene glycol (MIRALAX / GLYCOLAX) 17 g packet Take 17 g by mouth daily. One capful every other day   potassium chloride (K-DUR) 10 MEQ tablet Take 10 mEq by mouth daily.   simvastatin (ZOCOR) 20 MG tablet Take 20 mg by mouth at bedtime.   vitamin B-12 (CYANOCOBALAMIN) 500 MCG tablet Take 1,000 mcg by mouth daily.   [DISCONTINUED] levothyroxine (SYNTHROID) 75 MCG tablet Take 1 tablet (75 mcg total) by mouth daily before breakfast.   famotidine (PEPCID) 20 MG tablet Take 1 tablet (20 mg total) by mouth at bedtime. (Patient not taking: Reported on 02/13/2022)   levothyroxine (SYNTHROID) 75 MCG tablet Take 1 tablet (75 mcg total) by mouth daily before breakfast.   vortioxetine HBr (TRINTELLIX) 10 MG TABS tablet Take 10 mg by mouth daily. (Patient not taking: Reported on 06/16/2022)   No facility-administered encounter medications on file as of 06/16/2022.    ALLERGIES: Allergies  Allergen Reactions   Penicillins Hives and Itching    Hives Has patient had a PCN reaction causing immediate rash, facial/tongue/throat swelling, SOB or lightheadedness with hypotension:Yes Has patient had a PCN reaction causing severe rash involving mucus membranes or skin necrosis:No Has patient had a PCN reaction that required hospitalization:No Has patient had a PCN reaction occurring within the last 10 years:No If all of the above answers are "NO", then may proceed with Cephalosporin use.    VACCINATION STATUS:  There is no immunization history on file for this patient.   HPI   Nicole Simon  is a patient with the above medical history. she was diagnosed with thyroid cancer at approx age of 89 years old which required several surgeries, ending up in total  thyroidectomy and subsequent initiation of thyroid hormone replacement therapy. she was given various doses of Levothyroxine over the years (ranging between 75-88 mcg), currently on 75 micrograms. she reports compliance to this medication:  Taking it daily on empty stomach with water, separated by >30 minutes before breakfast and other medications, and by at least 4 hours from calcium, iron, PPIs,  multivitamins .  She does note she does take it close to her cup of coffee in the morning.  She has a granddaughter who is a Marine scientist and told her about separating her thyroid medication from her others.  I reviewed patient's thyroid tests:  Lab Results  Component Value Date   TSH 2.780 06/10/2022   TSH 0.506 02/06/2022   TSH 0.03 (A) 01/03/2022   TSH 0.38 (A) 11/22/2021   TSH 1.71 07/04/2020   FREET4 1.57 06/10/2022   FREET4 1.40 02/06/2022     Pt denies feeling nodules in neck, hoarseness, dysphagia/odynophagia, SOB with lying down.  she does not have family history of thyroid disorders.  No family history of thyroid cancer.  She does have personal history of thyroid cancer, had radiation treatment and total thyroidectomy. No recent use of iodine supplements.  Denies use of Biotin containing supplements.  I reviewed her chart and she also has a history of GERD, IBS, anemia, HTN.   ROS:  Constitutional: + stable body weight, no fatigue, no subjective hyperthermia, no subjective hypothermia Eyes: no blurry vision, no xerophthalmia ENT: no sore throat, no nodules palpated in throat, no dysphagia/odynophagia, no hoarseness Cardiovascular: no chest pain, no SOB, no leg swelling Respiratory: no cough, no SOB Gastrointestinal: no nausea/vomiting/diarrhea Musculoskeletal: no muscle/joint aches, walks with cane- gets unsteady on feet Skin: no rashes Neurological: no tremors, no numbness, no tingling, no dizziness Psychiatric: + depression, + anxiety-improved   Objective:   Objective     BP  (!) 150/80 (BP Location: Right Arm, Patient Position: Sitting, Cuff Size: Normal) Comment: Retake manuel cuff - patient states that she just took her medications  Pulse 90   Ht 5' 1"$  (1.549 m)   Wt 132 lb 9.6 oz (60.1 kg)   BMI 25.05 kg/m  Wt Readings from Last 3 Encounters:  06/16/22 132 lb 9.6 oz (60.1 kg)  02/13/22 130 lb 9.6 oz (59.2 kg)  02/06/22 129 lb 12.8 oz (58.9 kg)    BP Readings from Last 3 Encounters:  06/16/22 (!) 150/80  02/13/22 (!) 155/70  02/06/22 (!) 162/79      Physical Exam- Limited  Constitutional:  Body mass index is 25.05 kg/m. , not in acute distress, normal state of mind Eyes:  EOMI, no exophthalmos Musculoskeletal: no gross deformities, strength intact in all four extremities, no gross restriction of joint movements Skin:  no rashes, no hyperemia Neurological: no tremor with outstretched hands   CMP ( most recent) CMP     Component Value Date/Time   NA 139 12/17/2017 1250   K 3.5 12/17/2017 1250   CL 100 12/17/2017 1250   CO2 31 12/17/2017 1250   GLUCOSE 97 12/17/2017 1250   BUN 12 12/17/2017 1250   CREATININE 0.80 03/25/2020 0906   CALCIUM 8.1 (L) 12/17/2017 1250   PROT 7.1 12/17/2017 1250   ALBUMIN 3.9 12/17/2017 1250   AST 17 12/17/2017 1250   ALT 13 12/17/2017 1250   ALKPHOS 58 12/17/2017 1250   BILITOT 1.0 12/17/2017 1250   GFRNONAA >60 12/17/2017 1250   GFRAA >60 12/17/2017 1250     Diabetic Labs (most recent): No results found for: "HGBA1C", "MICROALBUR"   Lipid Panel ( most recent) Lipid Panel  No results found for: "CHOL", "TRIG", "HDL", "CHOLHDL", "VLDL", "LDLCALC", "LDLDIRECT", "LABVLDL"     Lab Results  Component Value Date   TSH 2.780 06/10/2022   TSH 0.506 02/06/2022   TSH 0.03 (A) 01/03/2022   TSH 0.38 (A) 11/22/2021  TSH 1.71 07/04/2020   FREET4 1.57 06/10/2022   FREET4 1.40 02/06/2022     Latest Reference Range & Units 11/22/21 00:00 01/03/22 00:00 02/06/22 10:34 06/10/22 11:11  TSH 0.450 - 4.500  uIU/mL 0.38 ! (E) 0.03 ! (E) 0.506 2.780  T4,Free(Direct) 0.82 - 1.77 ng/dL   1.40 1.57  !: Data is abnormal (E): External lab result  Assessment & Plan:   ASSESSMENT / PLAN:  1. Hypothyroidism-postoperative for hx of thyroid cancer   Patient with long-standing hypothyroidism, on levothyroxine therapy. On physical exam, patient does not have gross goiter, thyroid nodules, or neck compression symptoms.  Her previsit thyroid function tests are consistent with appropriate hormone replacement.  She is advised to continue her Levothyroxine 75 mcg po daily before breakfast, a safe dose based on her body weight.  Her TSH is slightly higher than I would like to see given her history of thyroid cancer, but may be due to the colder season.  There is no need for dosage change today.  Will see how her labs look at next visit and may consider increasing to 88 mcg after.  - We discussed about correct intake of levothyroxine, at fasting, with water, separated by at least 30 minutes from breakfast, and separated by more than 4 hours from calcium, iron, multivitamins, acid reflux medications (PPIs). -Patient is made aware of the fact that thyroid hormone replacement is needed for life, dose to be adjusted by periodic monitoring of thyroid function tests.       I spent  24  minutes in the care of the patient today including review of labs from Thyroid Function, CMP, and other relevant labs ; imaging/biopsy records (current and previous including abstractions from other facilities); face-to-face time discussing  her lab results and symptoms, medications doses, her options of short and long term treatment based on the latest standards of care / guidelines;   and documenting the encounter.  Jomarie Longs  participated in the discussions, expressed understanding, and voiced agreement with the above plans.  All questions were answered to her satisfaction. she is encouraged to contact clinic should she have any  questions or concerns prior to her return visit.   FOLLOW UP PLAN:  Return in about 4 months (around 10/15/2022) for Thyroid follow up, Previsit labs.  Rayetta Pigg, [facility] [address] [place], [place] [address] Phone: [phone] Fax: [phone]  06/16/2022, 10:38 AM

## 2022-06-22 ENCOUNTER — Ambulatory Visit (INDEPENDENT_AMBULATORY_CARE_PROVIDER_SITE_OTHER): Payer: Medicare Other | Admitting: Gastroenterology

## 2022-06-22 ENCOUNTER — Encounter (INDEPENDENT_AMBULATORY_CARE_PROVIDER_SITE_OTHER): Payer: Self-pay | Admitting: Gastroenterology

## 2022-06-22 VITALS — BP 157/67 | HR 86 | Temp 97.2°F | Ht 61.0 in | Wt 131.4 lb

## 2022-06-22 DIAGNOSIS — K581 Irritable bowel syndrome with constipation: Secondary | ICD-10-CM

## 2022-06-22 DIAGNOSIS — K219 Gastro-esophageal reflux disease without esophagitis: Secondary | ICD-10-CM | POA: Diagnosis not present

## 2022-06-22 MED ORDER — ESOMEPRAZOLE MAGNESIUM 40 MG PO CPDR: 40.0000 mg | DELAYED_RELEASE_CAPSULE | Freq: Every day | ORAL | 3 refills | Status: AC

## 2022-06-22 NOTE — Progress Notes (Signed)
Maylon Peppers, M.D. Gastroenterology & Hepatology [facility] [address] [place], [place] [address]  Primary Care Physician: Glenda Chroman, MD [place] [address]  I will communicate my assessment and recommendations to the referring MD via EMR.  Problems: IBS-C GERD  History of Present Illness: Trieste Wooters is a 89 y.o. female with PMH GERD, iron deficiency anemia, IBS-C, who presents for follow up of IBS-C and GERD.  The patient was last seen on 10/07/2021 (Dr. Laural Golden). At that time, the patient was continued on omeprazole 40 mg every day and famotidine at night.  She was given Trulance samples for constipation.  The patient denies having any nausea, vomiting, fever, chills, hematochezia, melena, hematemesis, abdominal distention, abdominal pain, diarrhea, jaundice, pruritus or weight loss.  Patient states that her appointment was canceled in December and has not been taking Nexium medication for the last month. She reports that she has felt frequent burning pain in her chest and upper abdomen on a daily basis. States that it was better before she ran out her Nexium. She is not taking Pepcid. No odynophagia or dysphagia.  She is taking Miralax every other day, but states that as long as she takes it she will have a BM. She takes Trulance when she gets too constipated - takes it every 2-3 days.  The patient denies having any nausea, vomiting, fever, chills, hematochezia, melena, hematemesis, abdominal distention, diarrhea, jaundice, pruritus or weight loss.  Last EGD: 3/22 5 cm hiatal hernia was found, multiple 3 to 5 mm sessile polyps in the gastric fundus and body.  Normal duodenum, biopsies negative for celiac disease or any duodenopathy's. Last Colonoscopy: 07/23/2020, normal terminal ileum, diverticulosis in the sigmoid and descending colon, small internal hemorrhoids.  Past Medical History: Past Medical History:   Diagnosis Date   Arthritis    Depression    GERD (gastroesophageal reflux disease)    Hypertension    Hypothyroidism    Peripheral neuropathy     Past Surgical History: Past Surgical History:  Procedure Laterality Date   ABDOMINAL HYSTERECTOMY     APPENDECTOMY     BACK SURGERY     BIOPSY  07/23/2020   Procedure: BIOPSY;  Surgeon: Harvel Quale, MD;  [facility];  Service: Gastroenterology;;   COLONOSCOPY     COLONOSCOPY  02/17/2012   Procedure: COLONOSCOPY;  Surgeon: Rogene Houston, MD;  [facility];  Service: Endoscopy;  Laterality: N/A;  1030   COLONOSCOPY N/A 10/05/2014   Procedure: COLONOSCOPY;  Surgeon: Rogene Houston, MD;  [facility];  Service: Endoscopy;  Laterality: N/A;  855   COLONOSCOPY N/A 12/11/2016   Procedure: COLONOSCOPY;  Surgeon: Rogene Houston, MD;  [facility];  Service: Endoscopy;  Laterality: N/A;  155   COLONOSCOPY WITH PROPOFOL N/A 07/23/2020   Castaneda: examined portion of ileum was normal, diverticulsos in sigmoid colon and descneding, non bleeding internal hemorrhoids, no specimens   DILATION AND CURETTAGE OF UTERUS     ESOPHAGEAL DILATION N/A 10/05/2014   Procedure: ESOPHAGEAL DILATION;  Surgeon: Rogene Houston, MD;  [facility];  Service: Endoscopy;  Laterality: N/A;   ESOPHAGOGASTRODUODENOSCOPY N/A 10/05/2014   Procedure: ESOPHAGOGASTRODUODENOSCOPY (EGD);  Surgeon: Rogene Houston, MD;  [facility];  Service: Endoscopy;  Laterality: N/A;   ESOPHAGOGASTRODUODENOSCOPY N/A 12/11/2016   Procedure: ESOPHAGOGASTRODUODENOSCOPY (EGD);  Surgeon: Rogene Houston, MD;  [facility];  Service:  Endoscopy;  Laterality: N/A;   ESOPHAGOGASTRODUODENOSCOPY (EGD) WITH PROPOFOL N/A 07/23/2020   Castaneda: 5cm hh, multiple gastric polyps, consistent with fundic gland plyps, normal examind duodenum, biopsied (normal)   THYROIDECTOMY      Family History: Family  History  Problem Relation Age of Onset   Colon cancer Neg Hx    Breast cancer Neg Hx     Social History: Social History   Tobacco Use  Smoking Status Never   Passive exposure: Never  Smokeless Tobacco Never   Social History   Substance and Sexual Activity  Alcohol Use No   Alcohol/week: 0.0 standard drinks of alcohol   Social History   Substance and Sexual Activity  Drug Use No    Allergies: Allergies  Allergen Reactions   Penicillins Hives and Itching    Hives Has patient had a PCN reaction causing immediate rash, facial/tongue/throat swelling, SOB or lightheadedness with hypotension:Yes Has patient had a PCN reaction causing severe rash involving mucus membranes or skin necrosis:No Has patient had a PCN reaction that required hospitalization:No Has patient had a PCN reaction occurring within the last 10 years:No If all of the above answers are "NO", then may proceed with Cephalosporin use.     Medications: Current Outpatient Medications  Medication Sig Dispense Refill   amLODipine (NORVASC) 5 MG tablet Take 5 mg by mouth daily.     ARIPiprazole (ABILIFY) 5 MG tablet Take 5 mg by mouth. One half tablet     aspirin EC 81 MG tablet Take 81 mg by mouth daily.     cetirizine (ZYRTEC) 10 MG tablet Take 10 mg by mouth as needed for allergies.     esomeprazole (NEXIUM) 40 MG capsule TAKE ONE CAPSULE BY MOUTH DAILY BEFORE BREAKFAST 30 capsule 5   ferrous sulfate 325 (65 FE) MG tablet Take 1 tablet (325 mg total) by mouth daily with breakfast. 90 tablet 1   gabapentin (NEURONTIN) 100 MG capsule Take 100 mg by mouth at bedtime.  12   hydroxychloroquine (PLAQUENIL) 200 MG tablet Take 200 mg by mouth daily.      levothyroxine (SYNTHROID) 75 MCG tablet Take 1 tablet (75 mcg total) by mouth daily before breakfast. 90 tablet 1   losartan (COZAAR) 100 MG tablet Take 100 mg by mouth daily.     Multiple Vitamins-Minerals (PRESERVISION AREDS 2 PO) Take by mouth daily at 6 (six) AM.      Plecanatide (TRULANCE) 3 MG TABS Take 3 mg by mouth in the morning. (Patient taking differently: Take 3 mg by mouth in the morning. As needed) 30 tablet 5   polyethylene glycol (MIRALAX / GLYCOLAX) 17 g packet Take 17 g by mouth daily. One capful every other day     potassium chloride (K-DUR) 10 MEQ tablet Take 10 mEq by mouth daily.     simvastatin (ZOCOR) 20 MG tablet Take 20 mg by mouth at bedtime.     vitamin B-12 (CYANOCOBALAMIN) 500 MCG tablet Take 1,000 mcg by mouth daily.     famotidine (PEPCID) 20 MG tablet Take 1 tablet (20 mg total) by mouth at bedtime. (Patient not taking: Reported on 02/13/2022) 90 tablet 3   vortioxetine HBr (TRINTELLIX) 10 MG TABS tablet Take 10 mg by mouth daily. (Patient not taking: Reported on 06/22/2022)     No current facility-administered medications for this visit.    Review of Systems: GENERAL: negative for malaise, night sweats HEENT: No changes in hearing or vision, no nose bleeds or other nasal problems. NECK:  Negative for lumps, goiter, pain and significant neck swelling RESPIRATORY: Negative for cough, wheezing CARDIOVASCULAR: Negative for chest pain, leg swelling, palpitations, orthopnea GI: SEE HPI MUSCULOSKELETAL: Negative for joint pain or swelling, back pain, and muscle pain. SKIN: Negative for lesions, rash PSYCH: Negative for sleep disturbance, mood disorder and recent psychosocial stressors. HEMATOLOGY Negative for prolonged bleeding, bruising easily, and swollen nodes. ENDOCRINE: Negative for cold or heat intolerance, polyuria, polydipsia and goiter. NEURO: negative for tremor, gait imbalance, syncope and seizures. The remainder of the review of systems is noncontributory.   Physical Exam: BP (!) 157/67 (BP Location: Right Arm, Patient Position: Sitting, Cuff Size: Large)   Pulse 86   Temp (!) 97.2 F (36.2 C) (Oral)   Ht 5' 1"$  (1.549 m)   Wt 131 lb 6.4 oz (59.6 kg)   BMI 24.83 kg/m  GENERAL: The patient is AO x3, in no  acute distress. HEENT: Head is normocephalic and atraumatic. EOMI are intact. Mouth is well hydrated and without lesions. NECK: Supple. No masses LUNGS: Clear to auscultation. No presence of rhonchi/wheezing/rales. Adequate chest expansion HEART: RRR, normal s1 and s2. ABDOMEN: Soft, nontender, no guarding, no peritoneal signs, and nondistended. BS +. No masses. EXTREMITIES: Without any cyanosis, clubbing, rash, lesions or edema. NEUROLOGIC: AOx3, no focal motor deficit. SKIN: no jaundice, no rashes  Imaging/Labs: as above  I personally reviewed and interpreted the available labs, imaging and endoscopic files.  Impression and Plan: Dorie Weimerskirch is a 89 y.o. female with PMH GERD, iron deficiency anemia, IBS-C, who presents for follow up of IBS-C and GERD.  The patient had adequate control of her GERD symptoms until she ran out of her PPI recently.  Her symptoms have recurred since then but she has not presented any red flag signs associated to this.  Will restart her Nexium 40 mg every day.  If she were to have persistent burning symptoms, she can take Pepcid at night as needed.  On her IBS-C, this has improved with the use of MiraLAX but she has not been taking this for now daily basis.  I advised her to take it daily and uptitrate as needed.  She can still take some Trulance.  If presenting persistent constipation.  -Restart Nexium 40 mg qday in AM 30 minutes before breakfast - If no improvement in burning pain, can start Pepcid OTC 20 mg at night. - Start taking Miralax 1 capful every day for one week. If bowel movements do not improve, increase to 2 capfuls every day. If after two weeks there is no improvement, increase to 2 capfuls in AM and one at night. - Continue Trulance PRN, samples provided  All questions were answered.      Maylon Peppers, MD Gastroenterology and Hepatology [facility]

## 2022-06-22 NOTE — Patient Instructions (Signed)
Restart Nexium 40 mg qday in AM 30 minutes before breakfast If no improvement in burning pain, can start Pepcid OTC 20 mg at night. Start taking Miralax 1 capful every day for one week. If bowel movements do not improve, increase to 2 capfuls every day. If after two weeks there is no improvement, increase to 2 capfuls in AM and one at night. Continue Trulance PRN, samples provided

## 2022-07-23 DIAGNOSIS — I700 Atherosclerosis of aorta: Secondary | ICD-10-CM | POA: Diagnosis not present

## 2022-07-23 DIAGNOSIS — Z0000 Encounter for general adult medical examination without abnormal findings: Secondary | ICD-10-CM | POA: Diagnosis not present

## 2022-07-23 DIAGNOSIS — Z7189 Other specified counseling: Secondary | ICD-10-CM | POA: Diagnosis not present

## 2022-07-23 DIAGNOSIS — I10 Essential (primary) hypertension: Secondary | ICD-10-CM | POA: Diagnosis not present

## 2022-07-23 DIAGNOSIS — Z299 Encounter for prophylactic measures, unspecified: Secondary | ICD-10-CM | POA: Diagnosis not present

## 2022-07-29 DIAGNOSIS — R413 Other amnesia: Secondary | ICD-10-CM | POA: Diagnosis not present

## 2022-08-13 DIAGNOSIS — Z299 Encounter for prophylactic measures, unspecified: Secondary | ICD-10-CM | POA: Diagnosis not present

## 2022-08-13 DIAGNOSIS — R413 Other amnesia: Secondary | ICD-10-CM | POA: Diagnosis not present

## 2022-08-13 DIAGNOSIS — I10 Essential (primary) hypertension: Secondary | ICD-10-CM | POA: Diagnosis not present

## 2022-08-13 DIAGNOSIS — I700 Atherosclerosis of aorta: Secondary | ICD-10-CM | POA: Diagnosis not present

## 2022-09-15 DIAGNOSIS — Z299 Encounter for prophylactic measures, unspecified: Secondary | ICD-10-CM | POA: Diagnosis not present

## 2022-09-15 DIAGNOSIS — I10 Essential (primary) hypertension: Secondary | ICD-10-CM | POA: Diagnosis not present

## 2022-09-15 DIAGNOSIS — I700 Atherosclerosis of aorta: Secondary | ICD-10-CM | POA: Diagnosis not present

## 2022-09-21 DIAGNOSIS — Z0000 Encounter for general adult medical examination without abnormal findings: Secondary | ICD-10-CM | POA: Diagnosis not present

## 2022-09-21 DIAGNOSIS — E039 Hypothyroidism, unspecified: Secondary | ICD-10-CM | POA: Diagnosis not present

## 2022-09-21 DIAGNOSIS — E7800 Pure hypercholesterolemia, unspecified: Secondary | ICD-10-CM | POA: Diagnosis not present

## 2022-09-21 DIAGNOSIS — Z299 Encounter for prophylactic measures, unspecified: Secondary | ICD-10-CM | POA: Diagnosis not present

## 2022-09-21 DIAGNOSIS — I10 Essential (primary) hypertension: Secondary | ICD-10-CM | POA: Diagnosis not present

## 2022-09-21 DIAGNOSIS — R5383 Other fatigue: Secondary | ICD-10-CM | POA: Diagnosis not present

## 2022-09-21 DIAGNOSIS — Z79899 Other long term (current) drug therapy: Secondary | ICD-10-CM | POA: Diagnosis not present

## 2022-09-22 LAB — BASIC METABOLIC PANEL
BUN: 12 (ref 4–21)
Creatinine: 0.7 (ref 0.5–1.1)

## 2022-09-22 LAB — LIPID PANEL
Cholesterol: 163 (ref 0–200)
HDL: 61 (ref 35–70)
LDL Cholesterol: 85
Triglycerides: 95 (ref 40–160)

## 2022-09-22 LAB — TSH
EGFR: 85
TSH: 1.08 (ref 0.41–5.90)

## 2022-10-15 ENCOUNTER — Ambulatory Visit: Payer: Medicare Other | Admitting: Nurse Practitioner

## 2022-10-21 ENCOUNTER — Ambulatory Visit: Payer: Medicare Other | Admitting: Nurse Practitioner

## 2022-10-21 ENCOUNTER — Encounter: Payer: Self-pay | Admitting: Nurse Practitioner

## 2022-10-21 VITALS — BP 140/84 | HR 73 | Ht 61.0 in | Wt 126.2 lb

## 2022-10-21 DIAGNOSIS — E890 Postprocedural hypothyroidism: Secondary | ICD-10-CM

## 2022-10-21 MED ORDER — LEVOTHYROXINE SODIUM 75 MCG PO TABS: 75.0000 ug | ORAL_TABLET | Freq: Every day | ORAL | 1 refills | Status: DC

## 2022-10-21 NOTE — Progress Notes (Signed)
Endocrinology Follow Up Note                                         10/21/2022, 10:04 AM  Subjective:   Subjective    Nicole Simon is a 89 y.o.-year-old female patient being seen in follow up after being seen in consultation for hypothyroidism referred by Nicole Specking, MD.   Past Medical History:  Diagnosis Date   Arthritis    Depression    GERD (gastroesophageal reflux disease)    Hypertension    Hypothyroidism    Peripheral neuropathy     Past Surgical History:  Procedure Laterality Date   ABDOMINAL HYSTERECTOMY     APPENDECTOMY     BACK SURGERY     BIOPSY  07/23/2020   Procedure: BIOPSY;  Surgeon: Nicole Frame, MD;  [facility];  Service: Gastroenterology;;   COLONOSCOPY     COLONOSCOPY  02/17/2012   Procedure: COLONOSCOPY;  Surgeon: Nicole Hippo, MD;  [facility];  Service: Endoscopy;  Laterality: N/A;  1030   COLONOSCOPY N/A 10/05/2014   Procedure: COLONOSCOPY;  Surgeon: Nicole Hippo, MD;  [facility];  Service: Endoscopy;  Laterality: N/A;  855   COLONOSCOPY N/A 12/11/2016   Procedure: COLONOSCOPY;  Surgeon: Nicole Hippo, MD;  [facility];  Service: Endoscopy;  Laterality: N/A;  155   COLONOSCOPY WITH PROPOFOL N/A 07/23/2020   Nicole Simon: examined portion of ileum was normal, diverticulsos in sigmoid colon and descneding, non bleeding internal hemorrhoids, no specimens   DILATION AND CURETTAGE OF UTERUS     ESOPHAGEAL DILATION N/A 10/05/2014   Procedure: ESOPHAGEAL DILATION;  Surgeon: Nicole Hippo, MD;  [facility];  Service: Endoscopy;  Laterality: N/A;   ESOPHAGOGASTRODUODENOSCOPY N/A 10/05/2014   Procedure: ESOPHAGOGASTRODUODENOSCOPY (EGD);  Surgeon: Nicole Hippo, MD;  [facility];  Service: Endoscopy;  Laterality: N/A;   ESOPHAGOGASTRODUODENOSCOPY N/A 12/11/2016   Procedure:  ESOPHAGOGASTRODUODENOSCOPY (EGD);  Surgeon: Nicole Hippo, MD;  [facility];  Service: Endoscopy;  Laterality: N/A;   ESOPHAGOGASTRODUODENOSCOPY (EGD) WITH PROPOFOL N/A 07/23/2020   Nicole Simon: 5cm hh, multiple gastric polyps, consistent with fundic gland plyps, normal examind duodenum, biopsied (normal)   THYROIDECTOMY      Social History   Socioeconomic History   Marital status: Widowed    Spouse name: Not on file   Number of children: Not on file   Years of education: Not on file   Highest education level: Not on file  Occupational History   Not on file  Tobacco Use   Smoking status: Never    Passive exposure: Never   Smokeless tobacco: Never  Vaping Use   Vaping Use: Never used  Substance and Sexual Activity   Alcohol use: No    Alcohol/week: 0.0 standard drinks of alcohol   Drug use: No   Sexual activity: Not on file  Other Topics Concern   Not on file  Social History Narrative  Not on file   Social Determinants of Health   Financial Resource Strain: Not on file  Food Insecurity: Not on file  Transportation Needs: Not on file  Physical Activity: Not on file  Stress: Not on file  Social Connections: Not on file    Family History  Problem Relation Age of Onset   Colon cancer Neg Hx    Breast cancer Neg Hx     Outpatient Encounter Medications as of 10/21/2022  Medication Sig   amLODipine (NORVASC) 5 MG tablet Take 5 mg by mouth daily.   ARIPiprazole (ABILIFY) 5 MG tablet Take 5 mg by mouth. One half tablet   aspirin EC 81 MG tablet Take 81 mg by mouth daily.   cetirizine (ZYRTEC) 10 MG tablet Take 10 mg by mouth as needed for allergies.   esomeprazole (NEXIUM) 40 MG capsule Take 1 capsule (40 mg total) by mouth daily.   ferrous sulfate 325 (65 FE) MG tablet Take 1 tablet (325 mg total) by mouth daily with breakfast.   gabapentin (NEURONTIN) 100 MG capsule Take 100 mg by mouth at bedtime.   hydroxychloroquine (PLAQUENIL) 200 MG tablet Take 200  mg by mouth daily.    losartan (COZAAR) 100 MG tablet Take 100 mg by mouth daily.   Multiple Vitamins-Minerals (PRESERVISION AREDS 2 PO) Take by mouth daily at 6 (six) AM.   Plecanatide (TRULANCE) 3 MG TABS Take 3 mg by mouth in the morning. (Patient taking differently: Take 3 mg by mouth in the morning. As needed)   polyethylene glycol (MIRALAX / GLYCOLAX) 17 g packet Take 17 g by mouth daily. One capful every other day   potassium chloride (K-DUR) 10 MEQ tablet Take 10 mEq by mouth daily.   simvastatin (ZOCOR) 20 MG tablet Take 20 mg by mouth at bedtime.   vitamin B-12 (CYANOCOBALAMIN) 500 MCG tablet Take 1,000 mcg by mouth daily.   [DISCONTINUED] levothyroxine (SYNTHROID) 75 MCG tablet Take 1 tablet (75 mcg total) by mouth daily before breakfast.   famotidine (PEPCID) 20 MG tablet Take 1 tablet (20 mg total) by mouth at bedtime. (Patient not taking: Reported on 02/13/2022)   levothyroxine (SYNTHROID) 75 MCG tablet Take 1 tablet (75 mcg total) by mouth daily before breakfast.   vortioxetine HBr (TRINTELLIX) 10 MG TABS tablet Take 10 mg by mouth daily. (Patient not taking: Reported on 10/21/2022)   No facility-administered encounter medications on file as of 10/21/2022.    ALLERGIES: Allergies  Allergen Reactions   Penicillins Hives and Itching    Hives Has patient had a PCN reaction causing immediate rash, facial/tongue/throat swelling, SOB or lightheadedness with hypotension:Yes Has patient had a PCN reaction causing severe rash involving mucus membranes or skin necrosis:No Has patient had a PCN reaction that required hospitalization:No Has patient had a PCN reaction occurring within the last 10 years:No If all of the above answers are "NO", then may proceed with Cephalosporin use.    VACCINATION STATUS:  There is no immunization history on file for this patient.   HPI   Nicole Simon  is a patient with the above medical history. she was diagnosed with thyroid cancer at approx age  of 89 years old which required several surgeries, ending up in total thyroidectomy and subsequent initiation of thyroid hormone replacement therapy. she was given various doses of Levothyroxine over the years (ranging between 75-88 mcg), currently on 75 micrograms. she reports compliance to this medication:  Taking it daily on empty stomach with water, separated by >30  minutes before breakfast and other medications, and by at least 4 hours from calcium, iron, PPIs, multivitamins .  She does note she does take it close to her cup of coffee in the morning.  She has a granddaughter who is a Engineer, civil (consulting) and told her about separating her thyroid medication from her others.  I reviewed patient's thyroid tests:  Lab Results  Component Value Date   TSH 1.08 09/22/2022   TSH 2.780 06/10/2022   TSH 0.506 02/06/2022   TSH 0.03 (A) 01/03/2022   TSH 0.38 (A) 11/22/2021   TSH 1.71 07/04/2020   FREET4 1.57 06/10/2022   FREET4 1.40 02/06/2022     Pt denies feeling nodules in neck, hoarseness, dysphagia/odynophagia, SOB with lying down.  she does not have family history of thyroid disorders.  No family history of thyroid cancer.  She does have personal history of thyroid cancer, had radiation treatment and total thyroidectomy. No recent use of iodine supplements.  Denies use of Biotin containing supplements.  I reviewed her chart and she also has a history of GERD, IBS, anemia, HTN.   ROS:  Constitutional: + decreasing body weight, no fatigue, no subjective hyperthermia, no subjective hypothermia Eyes: no blurry vision, no xerophthalmia ENT: no sore throat, no nodules palpated in throat, no dysphagia/odynophagia, no hoarseness Cardiovascular: no chest pain, no SOB, no leg swelling, + intermittent palpitations (?anxiety related) Respiratory: no cough, no SOB Gastrointestinal: no nausea/vomiting/diarrhea Musculoskeletal: no muscle/joint aches, walks with cane- gets unsteady on feet Skin: no  rashes Neurological: no tremors, no numbness, no tingling, no dizziness Psychiatric: + depression, + anxiety-stable   Objective:   Objective     BP (!) 140/84 (BP Location: Right Arm, Patient Position: Sitting, Cuff Size: Large) Comment: Retake Manuel Cuff  Pulse 73   Ht 5\' 1"  (1.549 m)   Wt 126 lb 3.2 oz (57.2 kg)   BMI 23.85 kg/m  Wt Readings from Last 3 Encounters:  10/21/22 126 lb 3.2 oz (57.2 kg)  06/22/22 131 lb 6.4 oz (59.6 kg)  06/16/22 132 lb 9.6 oz (60.1 kg)    BP Readings from Last 3 Encounters:  10/21/22 (!) 140/84  06/22/22 (!) 157/67  06/16/22 (!) 150/80      Physical Exam- Limited  Constitutional:  Body mass index is 23.85 kg/m. , not in acute distress, normal state of mind Eyes:  EOMI, no exophthalmos Neck: Supple Cardiovascular: RRR, no murmurs, rubs, or gallops, no edema Musculoskeletal: no gross deformities, strength intact in all four extremities, no gross restriction of joint movements, walks with cane Skin:  no rashes, no hyperemia Neurological: no tremor with outstretched hands   CMP ( most recent) CMP     Component Value Date/Time   NA 139 12/17/2017 1250   K 3.5 12/17/2017 1250   CL 100 12/17/2017 1250   CO2 31 12/17/2017 1250   GLUCOSE 97 12/17/2017 1250   BUN 12 09/22/2022 0000   CREATININE 0.7 09/22/2022 0000   CREATININE 0.80 03/25/2020 0906   CALCIUM 8.1 (L) 12/17/2017 1250   PROT 7.1 12/17/2017 1250   ALBUMIN 3.9 12/17/2017 1250   AST 17 12/17/2017 1250   ALT 13 12/17/2017 1250   ALKPHOS 58 12/17/2017 1250   BILITOT 1.0 12/17/2017 1250   GFRNONAA >60 12/17/2017 1250   GFRAA >60 12/17/2017 1250     Diabetic Labs (most recent): No results found for: "HGBA1C", "MICROALBUR"   Lipid Panel ( most recent) Lipid Panel     Component Value Date/Time   CHOL  163 09/22/2022 0000   TRIG 95 09/22/2022 0000   HDL 61 09/22/2022 0000   LDLCALC 85 09/22/2022 0000       Lab Results  Component Value Date   TSH 1.08 09/22/2022    TSH 2.780 06/10/2022   TSH 0.506 02/06/2022   TSH 0.03 (A) 01/03/2022   TSH 0.38 (A) 11/22/2021   TSH 1.71 07/04/2020   FREET4 1.57 06/10/2022   FREET4 1.40 02/06/2022     Latest Reference Range & Units 11/22/21 00:00 01/03/22 00:00 02/06/22 10:34 06/10/22 11:11 09/22/22 00:00  TSH 0.41 - 5.90  0.38 ! (E) 0.03 ! (E) 0.506 2.780 1.08 (E)  T4,Free(Direct) 0.82 - 1.77 ng/dL   1.61 0.96   !: Data is abnormal (E): External lab result  Assessment & Plan:   ASSESSMENT / PLAN:  1. Hypothyroidism-postoperative for hx of thyroid cancer   Patient with long-standing hypothyroidism, on levothyroxine therapy. On physical exam, patient does not have gross goiter, thyroid nodules, or neck compression symptoms.  Her previsit thyroid function tests are consistent with appropriate hormone replacement.  She is advised to continue her Levothyroxine 75 mcg po daily before breakfast, a safe dose based on her body weight.    - We discussed about correct intake of levothyroxine, at fasting, with water, separated by at least 30 minutes from breakfast, and separated by more than 4 hours from calcium, iron, multivitamins, acid reflux medications (PPIs). -Patient is made aware of the fact that thyroid hormone replacement is needed for life, dose to be adjusted by periodic monitoring of thyroid function tests.     I spent  17  minutes in the care of the patient today including review of labs from Thyroid Function, CMP, and other relevant labs ; imaging/biopsy records (current and previous including abstractions from other facilities); face-to-face time discussing  her lab results and symptoms, medications doses, her options of short and long term treatment based on the latest standards of care / guidelines;   and documenting the encounter.  Beacher May  participated in the discussions, expressed understanding, and voiced agreement with the above plans.  All questions were answered to her satisfaction. she is  encouraged to contact clinic should she have any questions or concerns prior to her return visit.   FOLLOW UP PLAN:  Return in about 4 months (around 02/20/2023) for Thyroid follow up, Previsit labs.  Ronny Bacon, [facility] [address] [place], [place] [address] Phone: [phone] Fax: [phone]  10/21/2022, 10:04 AM

## 2022-10-21 NOTE — Patient Instructions (Signed)

## 2022-12-28 DIAGNOSIS — I10 Essential (primary) hypertension: Secondary | ICD-10-CM | POA: Diagnosis not present

## 2022-12-28 DIAGNOSIS — D692 Other nonthrombocytopenic purpura: Secondary | ICD-10-CM | POA: Diagnosis not present

## 2022-12-28 DIAGNOSIS — Z299 Encounter for prophylactic measures, unspecified: Secondary | ICD-10-CM | POA: Diagnosis not present

## 2022-12-28 DIAGNOSIS — E039 Hypothyroidism, unspecified: Secondary | ICD-10-CM | POA: Diagnosis not present

## 2022-12-28 DIAGNOSIS — I700 Atherosclerosis of aorta: Secondary | ICD-10-CM | POA: Diagnosis not present

## 2022-12-28 LAB — TSH: TSH: 0.71 (ref 0.41–5.90)

## 2023-02-15 DIAGNOSIS — H524 Presbyopia: Secondary | ICD-10-CM | POA: Diagnosis not present

## 2023-02-15 DIAGNOSIS — H04123 Dry eye syndrome of bilateral lacrimal glands: Secondary | ICD-10-CM | POA: Diagnosis not present

## 2023-02-15 DIAGNOSIS — Z79899 Other long term (current) drug therapy: Secondary | ICD-10-CM | POA: Diagnosis not present

## 2023-03-02 ENCOUNTER — Ambulatory Visit: Payer: Medicare Other | Admitting: Nurse Practitioner

## 2023-03-02 ENCOUNTER — Encounter: Payer: Self-pay | Admitting: Nurse Practitioner

## 2023-03-02 VITALS — BP 138/60 | HR 81 | Ht 61.0 in | Wt 125.6 lb

## 2023-03-02 DIAGNOSIS — E890 Postprocedural hypothyroidism: Secondary | ICD-10-CM

## 2023-03-02 MED ORDER — LEVOTHYROXINE SODIUM 75 MCG PO TABS: 75.0000 ug | ORAL_TABLET | Freq: Every day | ORAL | 3 refills | Status: AC

## 2023-03-02 NOTE — Progress Notes (Signed)
Endocrinology Follow Up Note                                         03/02/2023, 10:40 AM  Subjective:   Subjective    Nicole Simon is a 89 y.o.-year-old female patient being seen in follow up after being seen in consultation for hypothyroidism referred by Ignatius Specking, MD.   Past Medical History:  Diagnosis Date   Arthritis    Depression    GERD (gastroesophageal reflux disease)    Hypertension    Hypothyroidism    Peripheral neuropathy     Past Surgical History:  Procedure Laterality Date   ABDOMINAL HYSTERECTOMY     APPENDECTOMY     BACK SURGERY     BIOPSY  07/23/2020   Procedure: BIOPSY;  Surgeon: Dolores Frame, MD;  Location: [facility];  Service: Gastroenterology;;   COLONOSCOPY     COLONOSCOPY  02/17/2012   Procedure: COLONOSCOPY;  Surgeon: Malissa Hippo, MD;  Location: [facility];  Service: Endoscopy;  Laterality: N/A;  1030   COLONOSCOPY N/A 10/05/2014   Procedure: COLONOSCOPY;  Surgeon: Malissa Hippo, MD;  Location: [facility];  Service: Endoscopy;  Laterality: N/A;  855   COLONOSCOPY N/A 12/11/2016   Procedure: COLONOSCOPY;  Surgeon: Malissa Hippo, MD;  Location: [facility];  Service: Endoscopy;  Laterality: N/A;  155   COLONOSCOPY WITH PROPOFOL N/A 07/23/2020   Castaneda: examined portion of ileum was normal, diverticulsos in sigmoid colon and descneding, non bleeding internal hemorrhoids, no specimens   DILATION AND CURETTAGE OF UTERUS     ESOPHAGEAL DILATION N/A 10/05/2014   Procedure: ESOPHAGEAL DILATION;  Surgeon: Malissa Hippo, MD;  Location: [facility];  Service: Endoscopy;  Laterality: N/A;   ESOPHAGOGASTRODUODENOSCOPY N/A 10/05/2014   Procedure: ESOPHAGOGASTRODUODENOSCOPY (EGD);  Surgeon: Malissa Hippo, MD;  [facility]: [facility];  Service: Endoscopy;  Laterality: N/A;   ESOPHAGOGASTRODUODENOSCOPY N/A 12/11/2016   Procedure:  ESOPHAGOGASTRODUODENOSCOPY (EGD);  Surgeon: Malissa Hippo, MD;  Location: [facility];  Service: Endoscopy;  Laterality: N/A;   ESOPHAGOGASTRODUODENOSCOPY (EGD) WITH PROPOFOL N/A 07/23/2020   Castaneda: 5cm hh, multiple gastric polyps, consistent with fundic gland plyps, normal examind duodenum, biopsied (normal)   THYROIDECTOMY      Social History   Socioeconomic History   Marital status: Widowed    Spouse name: Not on file   Number of children: Not on file   Years of education: Not on file   Highest education level: Not on file  Occupational History   Not on file  Tobacco Use   Smoking status: Never    Passive exposure: Never   Smokeless tobacco: Never  Vaping Use   Vaping status: Never Used  Substance and Sexual Activity   Alcohol use: No    Alcohol/week: 0.0 standard drinks of alcohol   Drug use: No   Sexual activity: Not on file  Other Topics Concern   Not on file  Social History Narrative  Not on file   Social Determinants of Health   Financial Resource Strain: Not on file  Food Insecurity: Not on file  Transportation Needs: Not on file  Physical Activity: Not on file  Stress: Not on file  Social Connections: Not on file    Family History  Problem Relation Age of Onset   Colon cancer Neg Hx    Breast cancer Neg Hx     Outpatient Encounter Medications as of 03/02/2023  Medication Sig   amLODipine (NORVASC) 5 MG tablet Take 5 mg by mouth daily.   ARIPiprazole (ABILIFY) 5 MG tablet Take 5 mg by mouth. One half tablet   aspirin EC 81 MG tablet Take 81 mg by mouth daily.   cetirizine (ZYRTEC) 10 MG tablet Take 10 mg by mouth as needed for allergies.   esomeprazole (NEXIUM) 40 MG capsule Take 1 capsule (40 mg total) by mouth daily.   ferrous sulfate 325 (65 FE) MG tablet Take 1 tablet (325 mg total) by mouth daily with breakfast.   gabapentin (NEURONTIN) 100 MG capsule Take 100 mg by mouth at bedtime.   hydroxychloroquine (PLAQUENIL) 200 MG tablet Take  200 mg by mouth daily.    losartan (COZAAR) 100 MG tablet Take 100 mg by mouth daily.   Multiple Vitamins-Minerals (PRESERVISION AREDS 2 PO) Take by mouth daily at 6 (six) AM.   Plecanatide (TRULANCE) 3 MG TABS Take 3 mg by mouth in the morning. (Patient taking differently: Take 3 mg by mouth in the morning. As needed)   polyethylene glycol (MIRALAX / GLYCOLAX) 17 g packet Take 17 g by mouth daily. One capful every other day   potassium chloride (K-DUR) 10 MEQ tablet Take 10 mEq by mouth daily.   simvastatin (ZOCOR) 20 MG tablet Take 20 mg by mouth at bedtime.   vitamin B-12 (CYANOCOBALAMIN) 500 MCG tablet Take 1,000 mcg by mouth daily.   [DISCONTINUED] levothyroxine (SYNTHROID) 75 MCG tablet Take 1 tablet (75 mcg total) by mouth daily before breakfast.   famotidine (PEPCID) 20 MG tablet Take 1 tablet (20 mg total) by mouth at bedtime. (Patient not taking: Reported on 02/13/2022)   levothyroxine (SYNTHROID) 75 MCG tablet Take 1 tablet (75 mcg total) by mouth daily before breakfast.   vortioxetine HBr (TRINTELLIX) 10 MG TABS tablet Take 10 mg by mouth daily. (Patient not taking: Reported on 10/21/2022)   No facility-administered encounter medications on file as of 03/02/2023.    ALLERGIES: Allergies  Allergen Reactions   Penicillins Hives and Itching    Hives Has patient had a PCN reaction causing immediate rash, facial/tongue/throat swelling, SOB or lightheadedness with hypotension:Yes Has patient had a PCN reaction causing severe rash involving mucus membranes or skin necrosis:No Has patient had a PCN reaction that required hospitalization:No Has patient had a PCN reaction occurring within the last 10 years:No If all of the above answers are "NO", then may proceed with Cephalosporin use.    VACCINATION STATUS:  There is no immunization history on file for this patient.   HPI   Nicole Simon  is a patient with the above medical history. she was diagnosed with thyroid cancer at approx  age of 89 years old which required several surgeries, ending up in total thyroidectomy and subsequent initiation of thyroid hormone replacement therapy. she was given various doses of Levothyroxine over the years (ranging between 75-88 mcg), currently on 75 micrograms. she reports compliance to this medication:  Taking it daily on empty stomach with water, separated by >30  minutes before breakfast and other medications, and by at least 4 hours from calcium, iron, PPIs, multivitamins .  She does note she does take it close to her cup of coffee in the morning.  She has a granddaughter who is a Engineer, civil (consulting) and told her about separating her thyroid medication from her others.  I reviewed patient's thyroid tests:  Lab Results  Component Value Date   TSH 0.71 12/28/2022   TSH 1.08 09/21/2022   TSH 2.780 06/10/2022   TSH 0.506 02/06/2022   TSH 0.03 (A) 01/03/2022   TSH 0.38 (A) 11/22/2021   TSH 1.71 07/04/2020   FREET4 1.57 06/10/2022   FREET4 1.40 02/06/2022     Pt denies feeling nodules in neck, hoarseness, dysphagia/odynophagia, SOB with lying down.  she does not have family history of thyroid disorders.  No family history of thyroid cancer.  She does have personal history of thyroid cancer, had radiation treatment and total thyroidectomy. No recent use of iodine supplements.  Denies use of Biotin containing supplements.  I reviewed her chart and she also has a history of GERD, IBS, anemia, HTN.   ROS:  Constitutional: + stable body weight, no fatigue, no subjective hyperthermia, no subjective hypothermia Eyes: no blurry vision, no xerophthalmia ENT: no sore throat, no nodules palpated in throat, no dysphagia/odynophagia, no hoarseness Cardiovascular: no chest pain, no SOB, no leg swelling,  Respiratory: no cough, no SOB Gastrointestinal: no nausea/vomiting/diarrhea Musculoskeletal: no muscle/joint aches, walks with cane- gets unsteady on feet Skin: no rashes Neurological: no tremors, no  numbness, no tingling, no dizziness Psychiatric: + depression, + anxiety-stable   Objective:   Objective     BP 138/60 (BP Location: Right Arm, Patient Position: Sitting, Cuff Size: Large)   Pulse 81   Ht 5\' 1"  (1.549 m)   Wt 125 lb 9.6 oz (57 kg)   BMI 23.73 kg/m  Wt Readings from Last 3 Encounters:  03/02/23 125 lb 9.6 oz (57 kg)  10/21/22 126 lb 3.2 oz (57.2 kg)  06/22/22 131 lb 6.4 oz (59.6 kg)    BP Readings from Last 3 Encounters:  03/02/23 138/60  10/21/22 (!) 140/84  06/22/22 (!) 157/67      Physical Exam- Limited  Constitutional:  Body mass index is 23.73 kg/m. , not in acute distress, normal state of mind Eyes:  EOMI, no exophthalmos Musculoskeletal: no gross deformities, strength intact in all four extremities, no gross restriction of joint movements, walks with cane Skin:  no rashes, no hyperemia Neurological: no tremor with outstretched hands   CMP ( most recent) CMP     Component Value Date/Time   NA 139 12/17/2017 1250   K 3.5 12/17/2017 1250   CL 100 12/17/2017 1250   CO2 31 12/17/2017 1250   GLUCOSE 97 12/17/2017 1250   BUN 12 09/22/2022 0000   CREATININE 0.7 09/22/2022 0000   CREATININE 0.80 03/25/2020 0906   CALCIUM 8.1 (L) 12/17/2017 1250   PROT 7.1 12/17/2017 1250   ALBUMIN 3.9 12/17/2017 1250   AST 17 12/17/2017 1250   ALT 13 12/17/2017 1250   ALKPHOS 58 12/17/2017 1250   BILITOT 1.0 12/17/2017 1250   GFRNONAA >60 12/17/2017 1250   GFRAA >60 12/17/2017 1250     Diabetic Labs (most recent): No results found for: "HGBA1C", "MICROALBUR"   Lipid Panel ( most recent) Lipid Panel     Component Value Date/Time   CHOL 163 09/22/2022 0000   TRIG 95 09/22/2022 0000   HDL 61 09/22/2022 0000  LDLCALC 85 09/22/2022 0000       Lab Results  Component Value Date   TSH 0.71 12/28/2022   TSH 1.08 09/21/2022   TSH 2.780 06/10/2022   TSH 0.506 02/06/2022   TSH 0.03 (A) 01/03/2022   TSH 0.38 (A) 11/22/2021   TSH 1.71 07/04/2020    FREET4 1.57 06/10/2022   FREET4 1.40 02/06/2022     Latest Reference Range & Units 11/22/21 00:00 01/03/22 00:00 02/06/22 10:34 06/10/22 11:11 09/21/22 00:00 12/28/22 00:00  TSH 0.41 - 5.90  0.38 ! (E) 0.03 ! (E) 0.506 2.780 1.08 (E) 0.71 (E)  T4,Free(Direct) 0.82 - 1.77 ng/dL   1.61 0.96    !: Data is abnormal (E): External lab result  Assessment & Plan:   ASSESSMENT / PLAN:  1. Hypothyroidism-postoperative for hx of thyroid cancer   Patient with long-standing hypothyroidism, on levothyroxine therapy. On physical exam, patient does not have gross goiter, thyroid nodules, or neck compression symptoms.  Her previsit thyroid function tests (only TSH was performed) are consistent with appropriate hormone replacement.  She is advised to continue her Levothyroxine 75 mcg po daily before breakfast, a safe dose based on her body weight.    - We discussed about correct intake of levothyroxine, at fasting, with water, separated by at least 30 minutes from breakfast, and separated by more than 4 hours from calcium, iron, multivitamins, acid reflux medications (PPIs). -Patient is made aware of the fact that thyroid hormone replacement is needed for life, dose to be adjusted by periodic monitoring of thyroid function tests.    I spent  19  minutes in the care of the patient today including review of labs from Thyroid Function, CMP, and other relevant labs ; imaging/biopsy records (current and previous including abstractions from other facilities); face-to-face time discussing  her lab results and symptoms, medications doses, her options of short and long term treatment based on the latest standards of care / guidelines;   and documenting the encounter.  Beacher May  participated in the discussions, expressed understanding, and voiced agreement with the above plans.  All questions were answered to her satisfaction. she is encouraged to contact clinic should she have any questions or concerns prior to  her return visit.   FOLLOW UP PLAN:  Return in about 6 months (around 08/31/2023) for Thyroid follow up, Previsit labs.  Ronny Bacon, [facility] [address] [place], [place] [address] Phone: [phone] Fax: [phone]  03/02/2023, 10:40 AM

## 2023-03-02 NOTE — Patient Instructions (Signed)

## 2023-04-09 IMAGING — MG MM DIGITAL SCREENING BILAT W/ TOMO AND CAD
8 series · 9 of 24 positions shown · non-contrast
Comparison: Previous exam(s).

CLINICAL DATA: Screening.

EXAM:
DIGITAL SCREENING BILATERAL MAMMOGRAM WITH TOMOSYNTHESIS AND CAD
TECHNIQUE: Bilateral screening digital craniocaudal and mediolateral oblique
mammograms were obtained. Bilateral screening digital breast
tomosynthesis was performed. The images were evaluated with
computer-aided detection.

[L CC synth-2D]
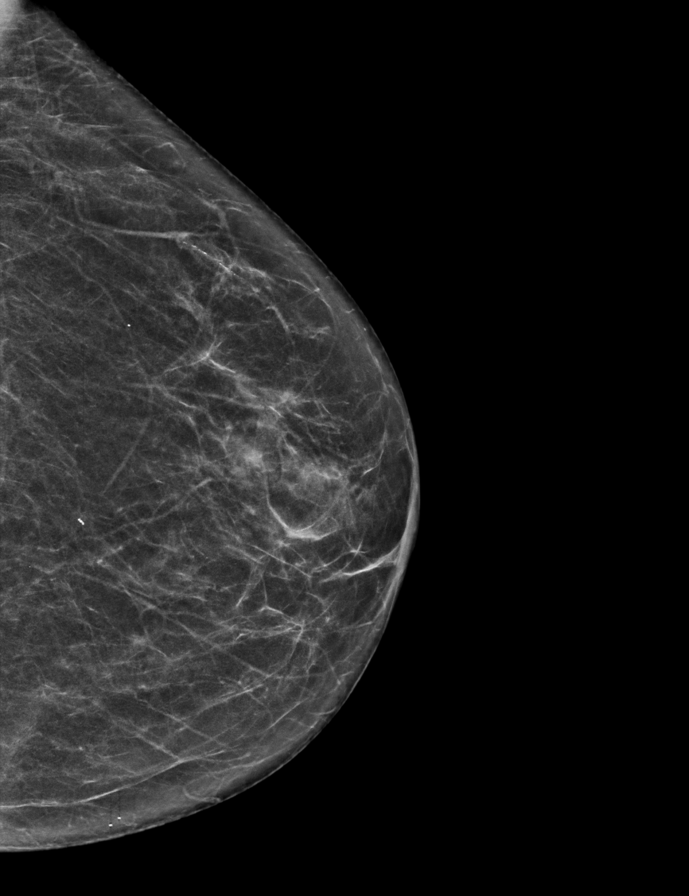

[R CC synth-2D]
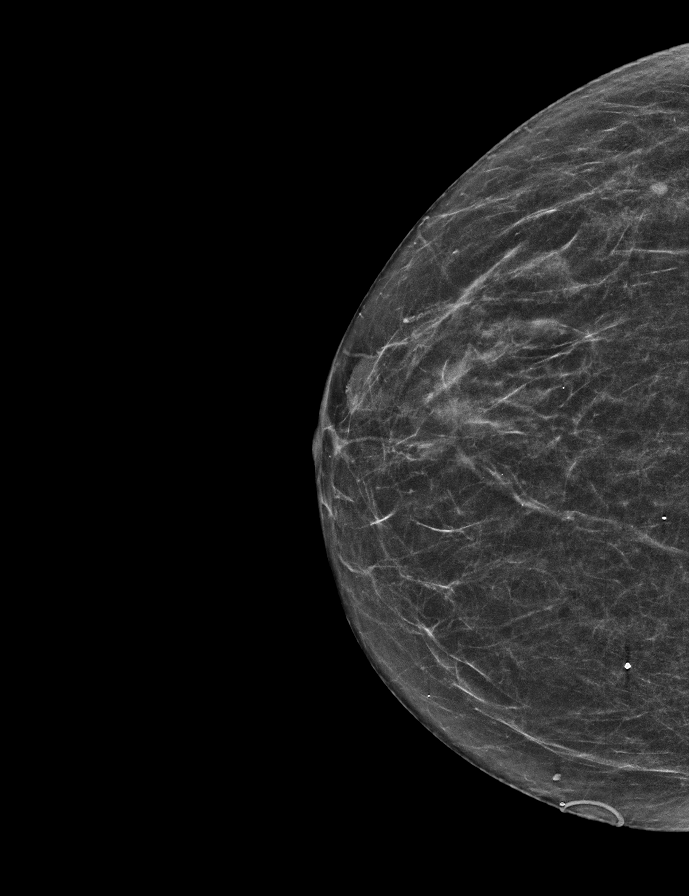

[L MLO synth-2D]
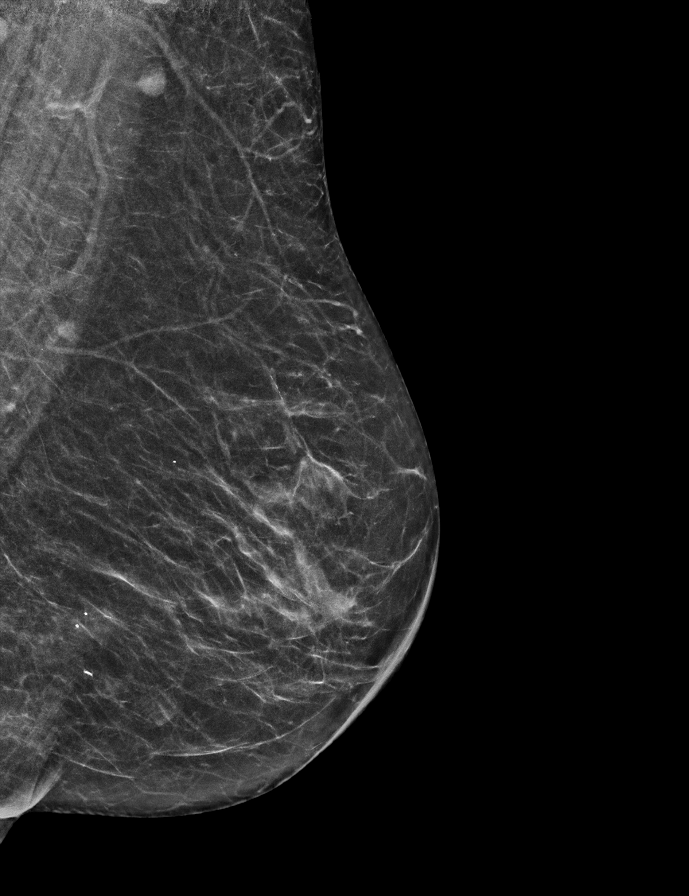

[R MLO synth-2D]
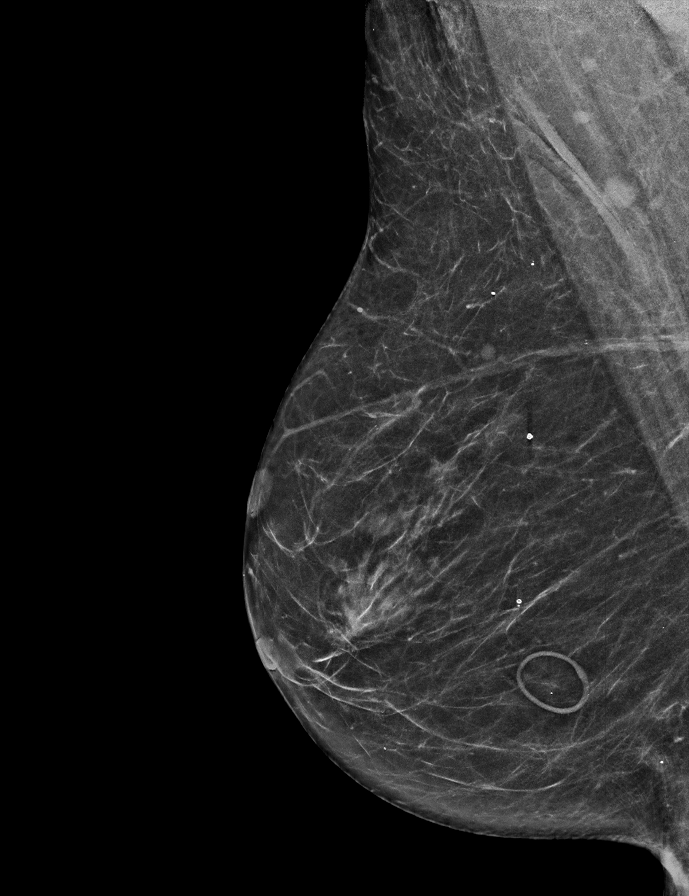

[R CC tomo · 2 of 50 frames shown]
[frame 17/50]
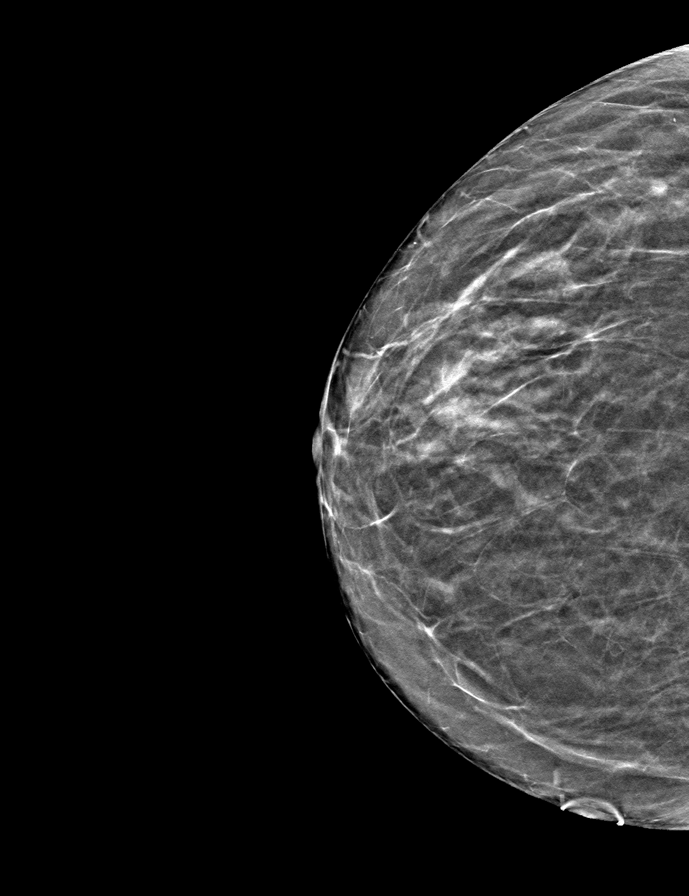
[frame 25/50]
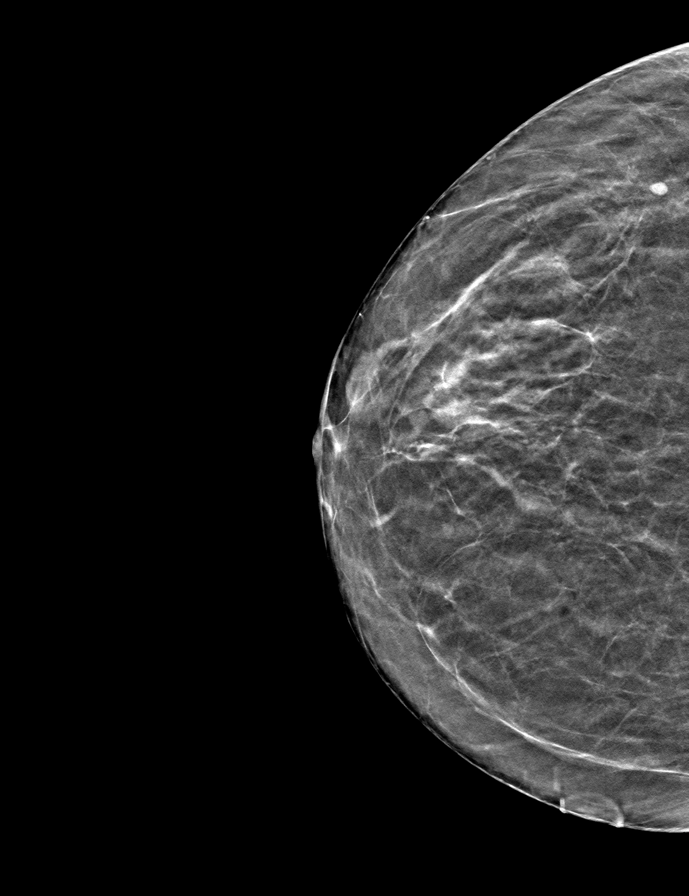

[R MLO tomo · tomo slice 31/61.0]
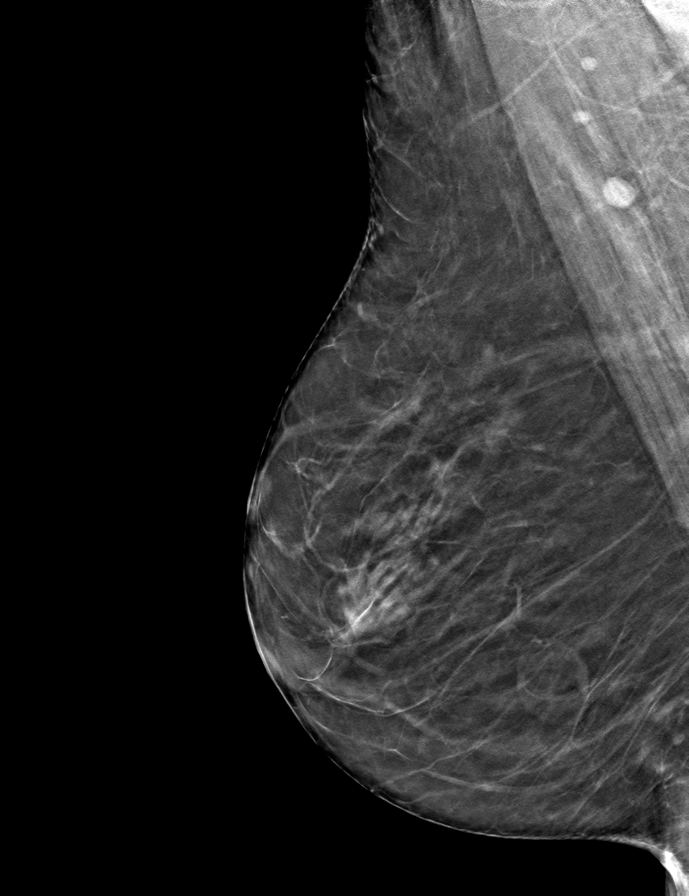

[L CC tomo · tomo slice 28/55.0]
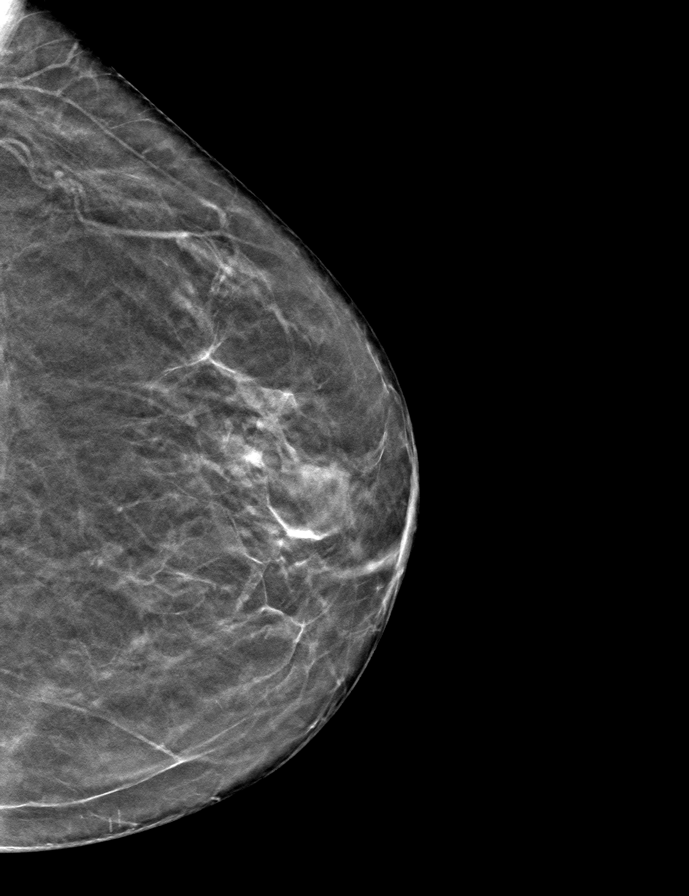

[L MLO tomo · tomo slice 30/59.0]
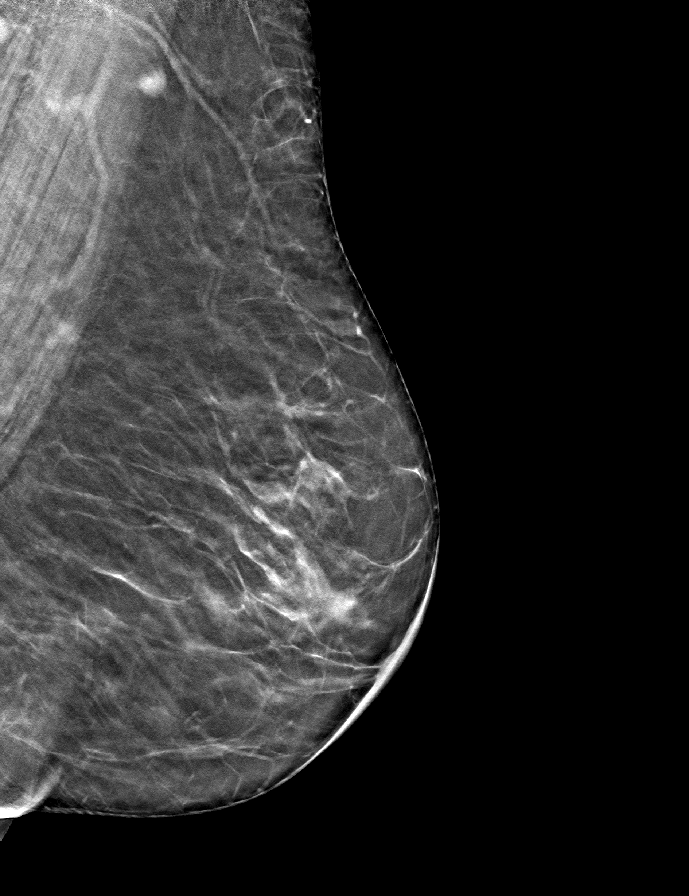

[9 of 24 positions shown; findings below may reference images not displayed]

ACR Breast Density Category b: There are scattered areas of
fibroglandular density.
FINDINGS: There are no findings suspicious for malignancy.
IMPRESSION: No mammographic evidence of malignancy. A result letter of this
screening mammogram will be mailed directly to the patient.

RECOMMENDATION:
Screening mammogram in one year. (Code:51-O-LD2)

BI-RADS CATEGORY  1: Negative.

## 2023-06-28 ENCOUNTER — Ambulatory Visit (INDEPENDENT_AMBULATORY_CARE_PROVIDER_SITE_OTHER): Payer: Medicare Other | Admitting: Gastroenterology

## 2023-08-31 ENCOUNTER — Ambulatory Visit: Payer: Medicare Other | Admitting: Nurse Practitioner

## 2024-03-08 ENCOUNTER — Encounter (INDEPENDENT_AMBULATORY_CARE_PROVIDER_SITE_OTHER): Payer: Self-pay | Admitting: Gastroenterology
